# Patient Record
Sex: Male | Born: 1950 | Race: White | Hispanic: No | Smoking: Never smoker
Health system: Southern US, Community
[De-identification: ages and names within clinical notes are randomized; demographics above are authoritative.]

## PROBLEM LIST (undated history)

## (undated) DIAGNOSIS — T7840XA Allergy, unspecified, initial encounter: Secondary | ICD-10-CM

## (undated) DIAGNOSIS — I2510 Atherosclerotic heart disease of native coronary artery without angina pectoris: Secondary | ICD-10-CM

## (undated) DIAGNOSIS — F419 Anxiety disorder, unspecified: Secondary | ICD-10-CM

## (undated) DIAGNOSIS — E785 Hyperlipidemia, unspecified: Secondary | ICD-10-CM

## (undated) DIAGNOSIS — I219 Acute myocardial infarction, unspecified: Secondary | ICD-10-CM

## (undated) DIAGNOSIS — M1990 Unspecified osteoarthritis, unspecified site: Secondary | ICD-10-CM

## (undated) DIAGNOSIS — K4090 Unilateral inguinal hernia, without obstruction or gangrene, not specified as recurrent: Secondary | ICD-10-CM

## (undated) DIAGNOSIS — I509 Heart failure, unspecified: Secondary | ICD-10-CM

## (undated) DIAGNOSIS — F32A Depression, unspecified: Secondary | ICD-10-CM

## (undated) HISTORY — DX: Allergy, unspecified, initial encounter: T78.40XA

## (undated) HISTORY — PX: HERNIA REPAIR: SHX51

## (undated) HISTORY — DX: Atherosclerotic heart disease of native coronary artery without angina pectoris: I25.10

## (undated) HISTORY — DX: Anxiety disorder, unspecified: F41.9

## (undated) HISTORY — PX: VASECTOMY: SHX75

## (undated) HISTORY — DX: Heart failure, unspecified: I50.9

## (undated) HISTORY — DX: Depression, unspecified: F32.A

## (undated) HISTORY — DX: Unspecified osteoarthritis, unspecified site: M19.90

## (undated) HISTORY — DX: Hyperlipidemia, unspecified: E78.5

---

## 2008-07-17 HISTORY — PX: CORONARY ANGIOPLASTY WITH STENT PLACEMENT: SHX49

## 2008-07-24 ENCOUNTER — Inpatient Hospital Stay (HOSPITAL_COMMUNITY): Admission: AD | Admit: 2008-07-24 | Discharge: 2008-07-25 | Payer: Self-pay | Admitting: Cardiovascular Disease

## 2011-01-24 ENCOUNTER — Other Ambulatory Visit: Payer: Self-pay | Admitting: Cardiovascular Disease

## 2011-01-24 DIAGNOSIS — E785 Hyperlipidemia, unspecified: Secondary | ICD-10-CM

## 2011-01-24 NOTE — Telephone Encounter (Signed)
Fax received from pharmacy. Jodette Meerab Maselli RN  

## 2011-03-01 NOTE — Cardiovascular Report (Signed)
NAME:  Jacob, Obrien NO.:  [id number]   MEDICAL RECORD NO.:  [id number]          PATIENT TYPE:  OIB   LOCATION:  2807                         FACILITY:  [facility]   PHYSICIAN:  Vesta Mixer, M.D. DATE OF BIRTH:  1951-05-30   DATE OF PROCEDURE:  07/24/2008  DATE OF DISCHARGE:                            CARDIAC CATHETERIZATION   Jacob Obrien is a 61 year old gentleman who has been recently in  relatively good health.  He does have a history of hypercholesterolemia.  He presented yesterday with symptoms consistent with unstable angina.  He was pain-free at that time.  Cardiac enzymes were drawn and later  returned positive.  He had already been scheduled to have a heart  catheterization this morning.   The procedures were left heart catheterization, coronary angiography  with PTCA, and stenting of the mid left anterior descending artery.   The right femoral artery was easily cannulated using a modified  Seldinger technique.   HEMODYNAMICS:  LV pressure is 106/4 with an aortic pressure of 98/70.   Angiography left main to left main is smooth and normal.   The left anterior descending artery has minor luminal irregularities  proximally.  The mid LAD has a 95%-99% stenosis with TIMI grade 2 flow  down the LAD and diagonal vessel.  The first diagonal artery is a fairly  large and normal vessel.  The second diagonal artery is a moderate-sized  vessel and is also normal.  The distal LAD has minimal luminal  irregularities.  There is mild-to-moderate calcification associated with  this lesion.   The left circumflex artery is moderate in size.  It is fairly smooth and  normal throughout its course.  It gives off moderate-sized obtuse  marginal artery which is normal.   The right coronary artery has an anterior takeoff.  It is large and  dominant.  There are minor luminal irregularities, but no significant  stenosis.  The posterior descending artery and the  posterolateral  segment artery are normal.   The left ventriculogram reveals overall normal left ventricular systolic  function.  The ejection fraction is between 50% and 55%.  There is  slight hypokinesis of the apex.  This actually may just be due to some  ventricular ectopy and delayed contractility.   The left main was engaged using a Judkins left 4 side 6-French guide.  Angiomax was given.  ACT was 271.   A [place] guidewire was positioned down into the distal LAD.  A 3.0 x  15-mm apex was positioned across the stenosis and was inflated up to 6  atmospheres for 27 seconds.  Following this, a 3.0 x 23-mm Promise was  placed across this stenosis.  It was placed across the index stenosis  and then out distally where the vessel was about 50% stenosed following  the lesion.  It was deployed at 10 atmospheres for 30 seconds and was  pulled back a few millimeters and was inflated up to 12 atmospheres for  20 seconds.  This was done to flare the proximal end slightly.  There  was a fairly big mismatch between the proximal end  of this lesion and  the distal end of the lesion.   Following this, a 3.5 x 12-mm [place] was positioned down for post-  dilatation.  It was positioned in the mid lesion and inflated up to 12  atmospheres for 17 seconds.  It was then positioned distally and  inflated up to 7 atmospheres for 17 seconds and then slightly more  distal for 7 atmospheres for 13 seconds.  It was then pulled back  proximally and inflated up to 13 atmospheres for 14 seconds.  This gave  [place] a very nice angiographic result with a 0% residual stenosis.  The  patient did have a little bit of chest pain which resolved fairly  quickly.  The patient was stable leaving the lab.   COMPLICATIONS:  None.   CONCLUSION:  Successful PTCA and stenting of the mid LAD.      Vesta Mixer, M.D.  Electronically Signed     PJN/MEDQ  D:  07/24/2008  T:  07/24/2008  Job:  [id number]   cc:   Gloriajean Dell. Andrey Campanile, M.D.

## 2011-03-01 NOTE — H&P (Signed)
NAME:  TRUE, GARCIAMARTINEZ NO.:  [id number]   MEDICAL RECORD NO.:  [id number]              PATIENT TYPE:   LOCATION:                                 FACILITY:   PHYSICIAN:  Vesta Mixer, M.D. DATE OF BIRTH:  08/07/1951   DATE OF ADMISSION:  DATE OF DISCHARGE:                              HISTORY & PHYSICAL   Jacob Obrien is a 60 year old gentleman with no significant past  medical history.  He is admitted for symptoms of unstable angina for a  heart catheterization.   Jacob Obrien has been a relatively healthy gentleman.  He has remained  quite active.  He has not had any particular problems.  His cholesterol  levels have been a little bit high.   For about a week or so he has been having some very vague episodes of  chest fullness.  For the past 2 days his episodes have gotten acutely  worse.  Yesterday he had some chest pain/fullness that started while he  was walking his dog.  The pain was slight, but it stopped when he  stopped to rest.  It lasted for about an hour or so.  The pain did not  recur.   This morning he was riding his bike to work, and he had very intense  pain.  It was 10/10 chest pain.  It was associated with lots of nausea.  He stopped and laid back, and the pain went away after a few minutes.  He was seen by his medical doctor this morning, was given an aspirin,  and the pain gradually subsided and was mostly relieved.   The pain radiates down into his right arm.  There is no diaphoresis.  He  denies any syncope or presyncope.   CURRENT MEDICATIONS:  Aspirin once a day.   ALLERGIES:  None.   PAST MEDICAL HISTORY:  Mild hypercholesterolemia.   SOCIAL HISTORY:  The patient does not smoke.  He drinks alcohol rarely.   FAMILY HISTORY:  His father has a history of coronary artery disease and  died of a myocardial infarction at age 40.  His mother died of old age  at age 48.  She had a history of angina.   REVIEW OF SYSTEMS:  His  review of systems is reviewed and is essentially  negative except for as noted in the HPI.   PHYSICAL EXAMINATION:  He is a middle-aged gentleman in no acute  distress.  He is alert and oriented x3, and his mood and affect are  normal.  His weight is 171, blood pressure 120/76 with heart rate of 72.  HEENT:  Reveals 2+ carotids.  He has no bruits, no JVD, no thyromegaly.  LUNGS:  Clear.  HEART:  Regular rate, S1, S2.  ABDOMEN:  Reveals good bowel sounds and is nontender.  EXTREMITIES:  He has no clubbing, cyanosis or edema.  NEUROLOGIC:  Nonfocal.   His EKG from Dr. Tawana Scale office reveals normal sinus rhythm.  He has  mild ST-segment depression in the inferior leads.  This is new from his  EKG back in February.  Jacob Obrien presents with some episodes of chest discomfort that are  consistent with unstable angina.  He has been given an aspirin, and I  have recommended that he continue to take an aspirin each day.  I have  given him a prescription for nitroglycerin.  We have also given him some  samples of Lipitor.  He is to start taking Lipitor tonight.  I have  asked him to call right away if he develops any episodes of chest pain  or pressure.  We will admit him for heart catheterization tomorrow  morning at 7:30.  We have discussed the risks, benefits, and options of  heart catheterization with possible angioplasty.  He understands and  agrees to proceed.      Vesta Mixer, M.D.  Electronically Signed     PJN/MEDQ  D:  07/23/2008  T:  07/23/2008  Job:  [id number]   cc:   Gloriajean Dell. Andrey Campanile, M.D.

## 2011-03-04 NOTE — Discharge Summary (Signed)
Jacob Obrien, Jacob Obrien            ACCOUNT NO.:  [id number]   MEDICAL RECORD NO.:  [id number]          PATIENT TYPE:  INP   LOCATION:  6529                         FACILITY:  [facility]   PHYSICIAN:  Vesta Mixer, M.D. DATE OF BIRTH:  December 07, 1950   DATE OF ADMISSION:  07/24/2008  DATE OF DISCHARGE:  07/25/2008                               DISCHARGE SUMMARY   DISCHARGE DIAGNOSES:  1. Coronary artery disease - status post percutaneous transluminal      coronary angioplasty and stenting of the mid left anterior      descending artery.  2. Hyperlipidemia.   DISCHARGE MEDICATIONS:  1. Aspirin 325 mg daily.  2. Plavix 75 mg daily.  3. Lipitor 4 mg daily.  4. Nitroglycerin 0.4 mg sublingually as needed.  5. Tylenol as needed for pain.   DISPOSITION:  The patient will return to see Dr. Elease Hashimoto in 1-2 weeks for  followup visit.   HISTORY:  [name] is a 60 year old gentleman with the history of  chest pains.  He was recently seen in the office.  He was referred for  heart catheterization.   A heart catheterization, he was found to have a mid 99% stenosis in his  LAD.  He had TIMI grade 2 flow.  His left circumflex artery and right  coronary arteries were unremarkable.  His left ventricular systolic  function was approximately 50%.   The patient had successful PTCA and stenting using a 3.0 x 23 mm PROMUS  stent.  It was postdilated using a 3.5-mm balloon.  We obtained a very  nice angiographic result with the 0% residual stenosis.  The patient did  quite well and did not have any further problems.  He will be discharged  on above-noted medications and disposition.  All of his other medical  problems are stable.      Vesta Mixer, M.D.  Electronically Signed     PJN/MEDQ  D:  10/09/2008  T:  10/09/2008  Job:  [id number]   cc:   Gloriajean Dell. Andrey Campanile, M.D.

## 2011-07-19 LAB — BASIC METABOLIC PANEL
CO2: 29
Calcium: 8.9
Creatinine, Ser: 1.03
GFR calc Af Amer: >60
GFR calc non Af Amer: >60
Sodium: 139

## 2011-07-19 LAB — CBC
Hemoglobin: 13
MCHC: 34.1
RBC: 3.86 — ABNORMAL LOW
WBC: 8

## 2011-07-19 LAB — POCT I-STAT, CHEM 8
Calcium, Ion: 1.21
HCT: 46
Hemoglobin: 15.6
Sodium: 140
TCO2: 25

## 2011-09-23 ENCOUNTER — Other Ambulatory Visit: Payer: Self-pay | Admitting: Cardiovascular Disease

## 2011-09-23 NOTE — Telephone Encounter (Signed)
Fax Received. Refill Completed. Jacob Obrien (M.A), Pt needs appointment then refill can be made

## 2011-10-25 ENCOUNTER — Other Ambulatory Visit: Payer: Self-pay | Admitting: *Deleted

## 2011-10-25 MED ORDER — CLOPIDOGREL BISULFATE 75 MG PO TABS: 75.0000 mg | ORAL_TABLET | Freq: Every day | ORAL | Status: DC

## 2011-10-25 NOTE — Telephone Encounter (Signed)
Called pt to see if he could make an appt with Dr. Elease Hashimoto because he have not had an OV since 2009. LM with office number stating that I will the dispensing 15 pills until he makes an appt. Fax Received. Refill Completed. Marion Rosenberry Chowoe (R.M.A)

## 2011-10-27 ENCOUNTER — Other Ambulatory Visit: Payer: Self-pay | Admitting: *Deleted

## 2011-10-27 NOTE — Telephone Encounter (Signed)
Received a  Paper request for a refill for pt.'s Plavix. Pharmacist stated that she received it and pt had not picked it up yet.  Pharmacist stated that pt had not picked up his med back in December. Called pt home number and it is disconnected. Pt needs appointment then refill can be made.Marland KitchenMarland KitchenOctavia Chowoe (R.M.A).

## 2011-11-03 ENCOUNTER — Encounter: Payer: Self-pay | Admitting: Cardiovascular Disease

## 2011-11-07 ENCOUNTER — Encounter: Payer: Self-pay | Admitting: Cardiovascular Disease

## 2011-11-07 ENCOUNTER — Ambulatory Visit (INDEPENDENT_AMBULATORY_CARE_PROVIDER_SITE_OTHER): Payer: BC Managed Care – PPO | Admitting: Cardiovascular Disease

## 2011-11-07 DIAGNOSIS — I2510 Atherosclerotic heart disease of native coronary artery without angina pectoris: Secondary | ICD-10-CM

## 2011-11-07 DIAGNOSIS — E785 Hyperlipidemia, unspecified: Secondary | ICD-10-CM

## 2011-11-07 MED ORDER — CLOPIDOGREL BISULFATE 75 MG PO TABS: 75.0000 mg | ORAL_TABLET | Freq: Every day | ORAL | Status: DC

## 2011-11-07 MED ORDER — ATORVASTATIN CALCIUM 40 MG PO TABS: 40.0000 mg | ORAL_TABLET | Freq: Every day | ORAL | Status: DC

## 2011-11-07 NOTE — Patient Instructions (Signed)
The current medical regimen is effective;  continue present plan and medications.  Follow up in 1 year with Dr Elease Hashimoto.  You will receive a letter in the mail 2 months before you are due.  Please call us when you receive this letter to schedule your follow up appointment.  Your physician recommends that you return for lab work in: 1 year (fasting lipid, liver and basic metabolic panel)

## 2011-11-07 NOTE — Assessment & Plan Note (Signed)
It is doing very well. He remains very active and has not had any episodes of angina. Has stent in his left anterior descending artery. Her to continue with the aspirin and Plavix. I'll see him again in one year. We'll plan on checking lipid levels at that time.

## 2011-11-07 NOTE — Progress Notes (Signed)
    Ernest Mallick Date of Birth  06-11-1951 [facility]     [place] [facility]  [address]    [address]   [address] [place], [place]  [address]    [place], [place]  [address] [phone]  Fax  [phone]  [phone]  Fax [phone]   History of Present Illness:  Problem list: 1. Coronary artery disease-status post PTCA and stenting of his left anterior descending artery-October, 2009. He had placement of a 3.0 x 23 mm Promus stent post dilated using a 3.5 mm x 12 mm [place] Voyager 2. Hyperlipidemia  Ed is a 61 yo with history of coronary artery disease.  He was seen last in the office approximately 2 years ago. He's done very well. He's remains very busy. He's not having episodes of chest pain or shortness breath. He puts in a large garden every year.  Current Outpatient Prescriptions on File Prior to Visit  Medication Sig Dispense Refill  . aspirin 325 MG tablet Take 325 mg by mouth daily.        Marland Kitchen atorvastatin (LIPITOR) 40 MG tablet TAKE 1 TABLET DAILY  30 tablet  11  . clopidogrel (PLAVIX) 75 MG tablet Take 1 tablet (75 mg total) by mouth daily.  15 tablet  0  . Multiple Vitamins-Minerals (MULTIVITAMIN PO) Take by mouth daily.          No Known Allergies  Past Medical History  Diagnosis Date  . Hyperlipidemia   . Coronary artery disease     status post PTCA and stenting of his LAD in October of 2009. He had placement of a 3.0x61mm Promus stent.     Past Surgical History  Procedure Date  . Coronary angioplasty with stent placement 07-2008    stenting of his left anterior descending artery. EF 50-55%. There is slighthypokinesis of the apex. The mid LAD has a 95%-99% stenosis with TIMI grade 2 flow down the LAD and diagonal vessel.    History  Smoking status  . Never Smoker   Smokeless tobacco  . Not on file    History  Alcohol Use  . Yes    Rarely    No family history on file.  Reviw of Systems:  Reviewed in the HPI.  All other systems  are negative.  Physical Exam: BP 120/80  Pulse 80  Ht 5\' 9"  (1.753 m)  Wt 170 lb 6.4 oz (77.293 kg)  BMI 25.16 kg/m2 The patient is alert and oriented x 3.  The mood and affect are normal.   Skin: warm and dry.  Color is normal.    HEENT:   His neck is supple. There is no JVD. His carotids are normal  Lungs: Lungs are clear to auscultation. His back is nontender.   Heart: Regular rate S1-S2. He has no murmurs.    Abdomen: Good bowel sounds. He has no hepatosplenomegaly.  Extremities:  No clubbing cyanosis or edema.  Neuro:  Her exam is nonfocal. His gait is normal.    ECG: NSR, no ST or T changes, normal ECG  Assessment / Plan:

## 2012-01-20 ENCOUNTER — Other Ambulatory Visit: Payer: Self-pay | Admitting: *Deleted

## 2012-01-23 ENCOUNTER — Other Ambulatory Visit: Payer: Self-pay | Admitting: *Deleted

## 2012-01-23 DIAGNOSIS — E785 Hyperlipidemia, unspecified: Secondary | ICD-10-CM

## 2012-01-23 MED ORDER — ATORVASTATIN CALCIUM 40 MG PO TABS: 40.0000 mg | ORAL_TABLET | Freq: Every day | ORAL | Status: DC

## 2012-01-23 NOTE — Telephone Encounter (Signed)
Refilled atorvastatin

## 2012-02-24 ENCOUNTER — Other Ambulatory Visit: Payer: Self-pay | Admitting: *Deleted

## 2012-02-24 MED ORDER — CLOPIDOGREL BISULFATE 75 MG PO TABS: 75.0000 mg | ORAL_TABLET | Freq: Every day | ORAL | Status: DC

## 2012-02-27 ENCOUNTER — Other Ambulatory Visit: Payer: Self-pay | Admitting: Cardiovascular Disease

## 2012-02-27 DIAGNOSIS — E785 Hyperlipidemia, unspecified: Secondary | ICD-10-CM

## 2012-02-27 MED ORDER — CLOPIDOGREL BISULFATE 75 MG PO TABS: 75.0000 mg | ORAL_TABLET | Freq: Every day | ORAL | Status: DC

## 2012-02-27 MED ORDER — ATORVASTATIN CALCIUM 40 MG PO TABS: 40.0000 mg | ORAL_TABLET | Freq: Every day | ORAL | Status: DC

## 2012-05-08 ENCOUNTER — Ambulatory Visit (INDEPENDENT_AMBULATORY_CARE_PROVIDER_SITE_OTHER): Payer: BC Managed Care – PPO | Admitting: Sports Medicine

## 2012-05-08 VITALS — BP 121/80 | Ht 69.0 in | Wt 170.0 lb

## 2012-05-08 DIAGNOSIS — M719 Bursopathy, unspecified: Secondary | ICD-10-CM

## 2012-05-08 DIAGNOSIS — M7520 Bicipital tendinitis, unspecified shoulder: Secondary | ICD-10-CM

## 2012-05-08 DIAGNOSIS — S46819A Strain of other muscles, fascia and tendons at shoulder and upper arm level, unspecified arm, initial encounter: Secondary | ICD-10-CM

## 2012-05-08 DIAGNOSIS — M75100 Unspecified rotator cuff tear or rupture of unspecified shoulder, not specified as traumatic: Secondary | ICD-10-CM

## 2012-05-08 DIAGNOSIS — M75101 Unspecified rotator cuff tear or rupture of right shoulder, not specified as traumatic: Secondary | ICD-10-CM | POA: Insufficient documentation

## 2012-05-08 DIAGNOSIS — M67929 Unspecified disorder of synovium and tendon, unspecified upper arm: Secondary | ICD-10-CM

## 2012-05-08 MED ORDER — NITROGLYCERIN 0.2 MG/HR TD PT24: MEDICATED_PATCH | TRANSDERMAL | Status: DC

## 2012-05-08 NOTE — Patient Instructions (Signed)
Exercises as described with 3 lb dumbbell.  Nitro patch as directed.  F/u in 4-6 weeks.

## 2012-05-08 NOTE — Progress Notes (Signed)
  Subjective:    Patient ID: Cartel Mauss, male    DOB: October 04, 1951, 61 y.o.   MRN: [id number]  HPI Presents with R lateral shoulder pain. Thinks may have begun splitting wood in fall. Tried to decrease activity over winter but without much improvement. Not able to throw overhead pitches. Reaching overhead and picking up something also bothers. Feels pain to lateral shoulder that can radiate to elbow. ? Numbness/tingling. Has not tried medicines or exercises to this point. No previous trouble with this shoulder.  Administrator GDS Review of Systems Per HPI.    Objective:   Physical Exam Shoulders: Full active and passive ROM bilaterally. 5/5 strength bilaterally including all rotator cuff muscles. Reproducible lateral shoulder pain with resisted flexion and empty can testing. Some pain with int/ext rotation. Sensation normal. Negative Speed's. Positive Yergason's.  In clinic u/s: --Partial tear and increased doppler flow seen in body of supraspinatus tendon. This is calcified so I suspect this is old No gapping with motion --Partial tear and edema seen in bicipital tendon. Infraspin and teres minor wnl AC with some increase fluid     Assessment & Plan:  1. Supraspinatus and bicipital tendinopathies.  --Will start on NTG protocol with 1/4 patch to affected area once daily. --Rehab exercises with 3 lb dumbbell. Nothing overhead as of yet. --F/u 4-6 weeks.

## 2012-05-08 NOTE — Assessment & Plan Note (Signed)
We will start on NTG protocol  Also begin gentle RC strength work for rehab  Reck and repeat scan 4 to 6 wks

## 2012-06-19 ENCOUNTER — Encounter: Payer: Self-pay | Admitting: Sports Medicine

## 2012-06-19 ENCOUNTER — Ambulatory Visit (INDEPENDENT_AMBULATORY_CARE_PROVIDER_SITE_OTHER): Payer: BC Managed Care – PPO | Admitting: Sports Medicine

## 2012-06-19 VITALS — BP 128/87 | HR 76 | Ht 69.0 in | Wt 170.0 lb

## 2012-06-19 DIAGNOSIS — M719 Bursopathy, unspecified: Secondary | ICD-10-CM

## 2012-06-19 DIAGNOSIS — M75101 Unspecified rotator cuff tear or rupture of right shoulder, not specified as traumatic: Secondary | ICD-10-CM

## 2012-06-19 NOTE — Assessment & Plan Note (Signed)
Excellent response  By [place] he still has partial bicipital tear with minimal change  Supraspinatus tear is almost healed on scan and exam it is much improved  See plan and we will reck in 6 wks

## 2012-06-19 NOTE — Patient Instructions (Addendum)
Start on exercise sheet as directed. Increasing weight to five lbs except on overhead exercises  Add Biceps exercises. On spokes of the wheel exercise do all three positions up and down Continue nitroglycerin Return appointment in 6 weeks.

## 2012-06-19 NOTE — Progress Notes (Signed)
  Subjective:    Patient ID: Jacob Obrien, male    DOB: 1951/03/11, 61 y.o.   MRN: [id number]  HPI Patient here for f/u of right shoulder pain. Previously seen approx 1 month ago and started on nitroglycerin protocol 1/4 patch daily as well as rotator cuff home exercises. Patient returns today stating that his pain is much better. He feels that his strength is about the same. He is sleeping better and can now reach behind himself without pain. He feels that he is about 60% better. He continues to do his home exercises daily and is doing the clock exercise and shoulder flexion. He has resumed swimming.  Review of Systems     Objective:   Physical Exam Full ROM on right shoulder flexion, extension, external rotation.  Slightly decreased ROM on internal rotation and back scratch of right shoulder as compared to left. 5/5 strength without pain on shoulder flexion, extension, internal and external rotation Negative Neer's, Hawkins 4+/5 on empty can Negative speed's, negative yergason's  Ultrasound Right Shoulder - Small hypoechoic area near proximal biceps tendon c/w fluid collection - Improvement in supraspinatus tendon with decreased hypoechogenicity and decreased doppler flow - normal infraspinatus and subscapular tendons    Assessment & Plan:  #1. Right Supraspinatus partial tear  - Significant improvement with nitroglycerin both in symptoms and on ultrasound assessment  - Continue nitroglycerin patch daily until next appointment  - Continue home exercises. Exercises added. Weight increased to 5lb on all exercises except overhead exercises  - Followup in one month.

## 2012-07-27 ENCOUNTER — Other Ambulatory Visit: Payer: Self-pay | Admitting: Sports Medicine

## 2012-07-31 ENCOUNTER — Ambulatory Visit: Payer: BC Managed Care – PPO | Admitting: Sports Medicine

## 2012-08-23 ENCOUNTER — Ambulatory Visit (INDEPENDENT_AMBULATORY_CARE_PROVIDER_SITE_OTHER): Payer: BC Managed Care – PPO | Admitting: Sports Medicine

## 2012-08-23 ENCOUNTER — Encounter: Payer: Self-pay | Admitting: Sports Medicine

## 2012-08-23 VITALS — BP 133/88 | HR 75 | Ht 69.0 in | Wt 170.0 lb

## 2012-08-23 DIAGNOSIS — M719 Bursopathy, unspecified: Secondary | ICD-10-CM

## 2012-08-23 DIAGNOSIS — M75101 Unspecified rotator cuff tear or rupture of right shoulder, not specified as traumatic: Secondary | ICD-10-CM

## 2012-08-23 NOTE — Assessment & Plan Note (Signed)
-  continues to improve with nitroglycerin and home exercises in regards symptoms and strength -planned ultrasound today to evaluate biceps tendon (supraspinatus had essentially healed at last visit) but patient unable to stay -will plan on repeat ultrasound in 4 weeks with continued nitroglycerin therapy during this time.   -patient is to add tricep extensions overhead and swinging curls to his rehab therapy. Continue weights at 5 lbs.  - Followup in one month.

## 2012-08-23 NOTE — Progress Notes (Signed)
Subjective:   1. Right shoulder pain follow up-Patient has been on nitroglycerin protocol since 05/08/12 related to supraspinatus partial tear and bicipital tendon partial tear. Patient compliant with home exercises and is working with 5 lbs weights. Continues to feel better. States he 80% better from 60% better at last appointment. Did have a slight set back when he played football with family a few weeks ago and experienced increased pain and a clicking noise for 4-5 days.   ROS--Denies nausea/vomiting/fever/chills/fatigue/overall sick feelings. Otherwise see HPI Past Medical History-history CAD Reviewed problem list.  Medications- reviewed and updated Chief complaint-noted  Objective: BP 133/88  Pulse 75  Ht 5\' 9"  (1.753 m)  Wt 170 lb (77.111 kg)  BMI 25.10 kg/m2 Gen: NAD Full ROM on right shoulder flexion, extension, external rotation. Previously decreased ROM on internal rotation and back scratch have resolved and patient has full range of motion.  5/5 strength without pain on shoulder flexion, extension, internal and external rotation. Strength increased to 5/5 on empty can test.  Negative Neer's, Hawkins  Negative yergason's. Patient does experience pain on speed's.   Assessment/Plan: See problem oriented charted

## 2012-09-19 ENCOUNTER — Ambulatory Visit (INDEPENDENT_AMBULATORY_CARE_PROVIDER_SITE_OTHER): Payer: BC Managed Care – PPO | Admitting: Sports Medicine

## 2012-09-19 ENCOUNTER — Encounter: Payer: Self-pay | Admitting: Sports Medicine

## 2012-09-19 VITALS — BP 134/86 | HR 70 | Ht 69.0 in | Wt 170.0 lb

## 2012-09-19 DIAGNOSIS — M75101 Unspecified rotator cuff tear or rupture of right shoulder, not specified as traumatic: Secondary | ICD-10-CM

## 2012-09-19 DIAGNOSIS — M67919 Unspecified disorder of synovium and tendon, unspecified shoulder: Secondary | ICD-10-CM

## 2012-09-19 NOTE — Progress Notes (Signed)
Patient ID: Jacob Obrien, male   DOB: September 13, 1951, 61 y.o.   MRN: [id number]  Patient returns for a four-month followup of a small supraspinatous tendon tear in his right shoulder but she also had some bicipital tendon fluid  He is much better than when he started. However he has returned to splitting some wood. With that he is noted pain when he reaches overhead again but no real weakness. He does not have night pain.  He complains of a quarter patch nitroglycerin without problems.  Examination No acute distress  RT Shoulder shows full range of motion  Right shoulder shows a positive empty can test but with good strength. Hawkins test is more positive. Neer Test is negative. Internal and external rotation are strong. O'Brien's test is negative  Crossover test does cause some pain  Ultrasound examination There continues to be a small area of swelling with some mild loss of tendon fibers in the bicipital tendon 2 cm below the rotator cuff The supraspinatous tendon is visualized and looks significantly improved with the calcification having resolved. On the posterior edge of the supraspinatous tendon is an area of hypoechoic change that correlates with where he had a tear of the early scans Infraspinatus and teres minor are normal A.c. joint shows mild arthritis

## 2012-09-19 NOTE — Assessment & Plan Note (Signed)
I think he is about 80% recovered from this injury. He has to be very careful with the with wood chopping as I think this recreated some swelling and irritation in his supraspinatous and in his biceps tendon.  Continue rehabilitation and nitroglycerin for the next 2 months. Recheck in 2 months

## 2012-09-27 ENCOUNTER — Other Ambulatory Visit: Payer: Self-pay | Admitting: Sports Medicine

## 2012-10-05 ENCOUNTER — Encounter: Payer: Self-pay | Admitting: Cardiovascular Disease

## 2012-11-20 ENCOUNTER — Ambulatory Visit (INDEPENDENT_AMBULATORY_CARE_PROVIDER_SITE_OTHER): Payer: 59 | Admitting: Sports Medicine

## 2012-11-20 VITALS — BP 120/76 | Ht 70.0 in | Wt 170.0 lb

## 2012-11-20 DIAGNOSIS — M719 Bursopathy, unspecified: Secondary | ICD-10-CM

## 2012-11-20 DIAGNOSIS — M75101 Unspecified rotator cuff tear or rupture of right shoulder, not specified as traumatic: Secondary | ICD-10-CM

## 2012-11-20 NOTE — Assessment & Plan Note (Signed)
I think we have reached probably a maximal benefit of nitroglycerin He can wean this off on an every other day basis Keep up biceps exercises to see if he can get a partial tear to heal  Keep up general rotator cuff exercises at least 3 times a week Gradually add to the weight  He can see me when necessary or if not completely resolved in 3 months

## 2012-11-20 NOTE — Progress Notes (Signed)
62 yo M here for follow up of rotator cuff syndrome.  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  Continuing nitro patch and rehab were recommended.  Since that time, the general pain has decreased-- is now only with specific movements such as throwing a football or reaching forward and straight overhead.  Has not noticed any weakness.  No swelling/redness/warmth.    Exam: Gen: No acute distress  R Shoulder: full range of motion; positive empty can test with significant resistance but with good strength. Hawkins test is mildly positive. Neer Test is negative. Internal and external rotation are strong. Speeds and yergason's only cause pain with significant resistance  Ultrasound: Healed supraspinatus Continued, possibly worsening tear of biceps tendon with increased hypoechoic change noted about 2 cm below where it joins the rotator cuff The tendon does not sublux outside the groove and there is still proximal tendon fiber visualized Infraspinatus and teres minor are normal Subscapularis is normal No in impingement visualized on dynamic motion

## 2012-11-22 ENCOUNTER — Other Ambulatory Visit: Payer: Self-pay | Admitting: Sports Medicine

## 2013-03-04 ENCOUNTER — Other Ambulatory Visit: Payer: Self-pay | Admitting: *Deleted

## 2013-03-04 DIAGNOSIS — E785 Hyperlipidemia, unspecified: Secondary | ICD-10-CM

## 2013-03-04 MED ORDER — ATORVASTATIN CALCIUM 40 MG PO TABS: 40.0000 mg | ORAL_TABLET | Freq: Every day | ORAL | Status: DC

## 2013-03-04 NOTE — Telephone Encounter (Signed)
Fax Received. Refill Completed. Jacob Obrien (R.M.A)   

## 2013-03-18 ENCOUNTER — Other Ambulatory Visit: Payer: Self-pay | Admitting: *Deleted

## 2013-03-18 MED ORDER — CLOPIDOGREL BISULFATE 75 MG PO TABS: 75.0000 mg | ORAL_TABLET | Freq: Every day | ORAL | Status: DC

## 2013-03-18 NOTE — Telephone Encounter (Signed)
Pt is aware of office location. Fax Received. Refill Completed. Jacob Obrien (R.M.A)

## 2013-05-01 ENCOUNTER — Other Ambulatory Visit: Payer: Self-pay | Admitting: *Deleted

## 2013-05-01 DIAGNOSIS — E785 Hyperlipidemia, unspecified: Secondary | ICD-10-CM

## 2013-05-01 MED ORDER — ATORVASTATIN CALCIUM 40 MG PO TABS: 40.0000 mg | ORAL_TABLET | Freq: Every day | ORAL | Status: DC

## 2013-05-01 NOTE — Telephone Encounter (Signed)
NO REFILLS UNTIL APPOINTMENT Fax Received. Refill Completed. Cyani Kallstrom Chowoe (R.M.A)   

## 2013-05-13 ENCOUNTER — Ambulatory Visit (INDEPENDENT_AMBULATORY_CARE_PROVIDER_SITE_OTHER): Payer: 59 | Admitting: Cardiovascular Disease

## 2013-05-13 ENCOUNTER — Encounter: Payer: Self-pay | Admitting: Cardiovascular Disease

## 2013-05-13 VITALS — BP 130/88 | HR 78 | Ht 70.0 in | Wt 171.0 lb

## 2013-05-13 DIAGNOSIS — I2510 Atherosclerotic heart disease of native coronary artery without angina pectoris: Secondary | ICD-10-CM

## 2013-05-13 DIAGNOSIS — E785 Hyperlipidemia, unspecified: Secondary | ICD-10-CM

## 2013-05-13 NOTE — Patient Instructions (Addendum)
Your physician wants you to follow-up in: 1 year  You will receive a reminder letter in the mail two months in advance. If you don't receive a letter, please call our office to schedule the follow-up appointment.   Your physician recommends that you return for a FASTING lipid profile: tomorrow

## 2013-05-13 NOTE — Progress Notes (Signed)
Jacob Obrien Date of Birth  [birth year] Jacob [place]     [place] [facility]  [address]    [address]   [address] [place], [place]  [address]    [place], [place]  [address] [phone]  Fax  [phone]  [phone]  Fax [phone]   History of Present Illness:  Problem list: 1. Coronary artery disease-status post PTCA and stenting of his left anterior descending artery-October, 2009. He had placement of a 3.0 x 23 mm Promus stent post dilated using a 3.5 mm x 12 mm [place] Voyager 2. Hyperlipidemia  Jacob Obrien is a 62 yo with history of coronary artery disease.  He was seen last in the office approximately 2 years ago. He's done very well. He's remains very busy. He's not having episodes of chest pain or shortness breath. He puts in a large garden every year.  May 13, 2013:  Jacob Obrien is doing well.  He is retiring in a year.  He has not had any angina.  He remains active.   Current Outpatient Prescriptions on File Prior to Visit  Medication Sig Dispense Refill  . aspirin 81 MG tablet Take 81 mg by mouth daily.      Marland Kitchen atorvastatin (LIPITOR) 40 MG tablet Take 1 tablet (40 mg total) by mouth daily.  30 tablet  0  . clopidogrel (PLAVIX) 75 MG tablet Take 1 tablet (75 mg total) by mouth daily.  90 tablet  0  . Cyanocobalamin (B-12 PO) Take by mouth daily.      . Multiple Vitamins-Minerals (MULTIVITAMIN PO) Take by mouth daily.        . nitroGLYCERIN (NITRODUR - DOSED IN MG/24 HR) 0.2 mg/hr APPLY 1/4 PATCH DAILY FOR 24 HOURS AS DIRECTED.  30 patch  0   No current facility-administered medications on file prior to visit.    No Known Allergies  Past Medical History  Diagnosis Date  . Hyperlipidemia   . Coronary artery disease     status post PTCA and stenting of his LAD in October of 2009. He had placement of a 3.0x34mm Promus stent.     Past Surgical History  Procedure Laterality Date  . Coronary angioplasty with stent placement  07-2008    stenting of his left  anterior descending artery. EF 50-55%. There is slighthypokinesis of the apex. The mid LAD has a 95%-99% stenosis with TIMI grade 2 flow down the LAD and diagonal vessel.    History  Smoking status  . Never Smoker   Smokeless tobacco  . Never Used    History  Alcohol Use  . Yes    Comment: Rarely    No family history on file.  Reviw of Systems:  Reviewed in the HPI.  All other systems are negative.  Physical Exam: BP 130/88  Pulse 78  Ht 5\' 10"  (1.778 m)  Wt 171 lb (77.565 kg)  BMI 24.54 kg/m2 The patient is alert and oriented x 3.  The mood and affect are normal.   Skin: warm and dry.  Color is normal.    HEENT:   His neck is supple. There is no JVD. His carotids are normal  Lungs: Lungs are clear to auscultation. His back is nontender.   Heart: Regular rate S1-S2. He has no murmurs.    Abdomen: Good bowel sounds. He has no hepatosplenomegaly.  Extremities:  No clubbing cyanosis or edema.  Neuro:  Her exam is nonfocal. His gait is normal.    ECG: [place]  28, 2014:  NSR at 78.  Normal ECG.  Assessment / Plan:

## 2013-05-13 NOTE — Assessment & Plan Note (Signed)
[name] is doing very well. He's not had any episodes of chest pain.   He has a history of PCI in 2009. We will continue with his same medications. We'll check fasting lipid profile, basic lipid profile, and hepatic profile tomorrow.  I will see him again in one year for followup office visit, EKG, and fasting labs.

## 2013-05-14 ENCOUNTER — Other Ambulatory Visit (INDEPENDENT_AMBULATORY_CARE_PROVIDER_SITE_OTHER): Payer: 59

## 2013-05-14 DIAGNOSIS — E785 Hyperlipidemia, unspecified: Secondary | ICD-10-CM

## 2013-05-14 DIAGNOSIS — I2510 Atherosclerotic heart disease of native coronary artery without angina pectoris: Secondary | ICD-10-CM

## 2013-05-14 LAB — HEPATIC FUNCTION PANEL
ALT: 26 U/L (ref 0–53)
Albumin: 4.2 g/dL (ref 3.5–5.2)
Alkaline Phosphatase: 53 U/L (ref 39–117)
Bilirubin, Direct: 0 mg/dL (ref 0.0–0.3)
Total Protein: 7 g/dL (ref 6.0–8.3)

## 2013-05-14 LAB — LIPID PANEL
Cholesterol: 157 mg/dL (ref 0–200)
Triglycerides: 62 mg/dL (ref 0.0–149.0)
VLDL: 12.4 mg/dL (ref 0.0–40.0)

## 2013-05-14 LAB — BASIC METABOLIC PANEL
BUN: 22 mg/dL (ref 6–23)
Chloride: 103 mEq/L (ref 96–112)
Creatinine, Ser: 1 mg/dL (ref 0.4–1.5)
GFR: 85.08 mL/min (ref >60.00)
Potassium: 4.2 mEq/L (ref 3.5–5.1)

## 2013-05-20 ENCOUNTER — Other Ambulatory Visit: Payer: Self-pay

## 2013-05-20 MED ORDER — CLOPIDOGREL BISULFATE 75 MG PO TABS: 75.0000 mg | ORAL_TABLET | Freq: Every day | ORAL | Status: DC

## 2013-06-05 ENCOUNTER — Other Ambulatory Visit: Payer: Self-pay | Admitting: Cardiovascular Disease

## 2013-07-11 ENCOUNTER — Telehealth: Payer: Self-pay | Admitting: Cardiovascular Disease

## 2013-07-11 NOTE — Telephone Encounter (Signed)
Walk In pt Form " Papers dropped Off" will hold onto Until Jodette back in Office  On Friday 07/11/13/KM

## 2013-08-04 ENCOUNTER — Other Ambulatory Visit: Payer: Self-pay | Admitting: Sports Medicine

## 2013-08-06 ENCOUNTER — Other Ambulatory Visit: Payer: Self-pay

## 2013-08-06 MED ORDER — ATORVASTATIN CALCIUM 40 MG PO TABS: ORAL_TABLET | ORAL | Status: DC

## 2013-08-22 ENCOUNTER — Other Ambulatory Visit: Payer: Self-pay

## 2013-10-14 ENCOUNTER — Encounter: Payer: Self-pay | Admitting: Cardiovascular Disease

## 2013-10-15 ENCOUNTER — Encounter: Payer: Self-pay | Admitting: Cardiovascular Disease

## 2013-10-21 ENCOUNTER — Other Ambulatory Visit: Payer: Self-pay

## 2013-10-21 MED ORDER — ATORVASTATIN CALCIUM 40 MG PO TABS: ORAL_TABLET | ORAL | Status: DC

## 2013-10-21 MED ORDER — CLOPIDOGREL BISULFATE 75 MG PO TABS: 75.0000 mg | ORAL_TABLET | Freq: Every day | ORAL | Status: DC

## 2014-02-06 ENCOUNTER — Other Ambulatory Visit: Payer: Self-pay | Admitting: Cardiovascular Disease

## 2014-04-11 ENCOUNTER — Other Ambulatory Visit: Payer: Self-pay | Admitting: Cardiovascular Disease

## 2014-04-28 ENCOUNTER — Encounter: Payer: Self-pay | Admitting: Cardiovascular Disease

## 2014-04-28 ENCOUNTER — Ambulatory Visit (INDEPENDENT_AMBULATORY_CARE_PROVIDER_SITE_OTHER): Payer: 59 | Admitting: Cardiovascular Disease

## 2014-04-28 VITALS — BP 124/90 | HR 71 | Ht 70.0 in | Wt 166.0 lb

## 2014-04-28 DIAGNOSIS — I2510 Atherosclerotic heart disease of native coronary artery without angina pectoris: Secondary | ICD-10-CM

## 2014-04-28 DIAGNOSIS — I209 Angina pectoris, unspecified: Secondary | ICD-10-CM

## 2014-04-28 DIAGNOSIS — I25119 Atherosclerotic heart disease of native coronary artery with unspecified angina pectoris: Secondary | ICD-10-CM

## 2014-04-28 LAB — LIPID PANEL
Cholesterol: 147 mg/dL (ref 0–200)
HDL: 68 mg/dL (ref >39.00)
LDL Cholesterol: 66 mg/dL (ref 0–99)
NONHDL: 79
Total CHOL/HDL Ratio: 2
Triglycerides: 66 mg/dL (ref 0.0–149.0)
VLDL: 13.2 mg/dL (ref 0.0–40.0)

## 2014-04-28 LAB — HEPATIC FUNCTION PANEL
ALBUMIN: 4.1 g/dL (ref 3.5–5.2)
ALT: 23 U/L (ref 0–53)
AST: 22 U/L (ref 0–37)
Alkaline Phosphatase: 48 U/L (ref 39–117)
BILIRUBIN TOTAL: 1 mg/dL (ref 0.2–1.2)
Bilirubin, Direct: 0 mg/dL (ref 0.0–0.3)
Total Protein: 6.8 g/dL (ref 6.0–8.3)

## 2014-04-28 LAB — BASIC METABOLIC PANEL
BUN: 23 mg/dL (ref 6–23)
CHLORIDE: 109 meq/L (ref 96–112)
CO2: 25 meq/L (ref 19–32)
Calcium: 8.7 mg/dL (ref 8.4–10.5)
Creatinine, Ser: 0.8 mg/dL (ref 0.4–1.5)
GFR: 99.12 mL/min (ref >60.00)
GLUCOSE: 99 mg/dL (ref 70–99)
POTASSIUM: 4 meq/L (ref 3.5–5.1)
SODIUM: 140 meq/L (ref 135–145)

## 2014-04-28 MED ORDER — CLOPIDOGREL BISULFATE 75 MG PO TABS: 75.0000 mg | ORAL_TABLET | Freq: Every day | ORAL | Status: DC

## 2014-04-28 MED ORDER — ATORVASTATIN CALCIUM 40 MG PO TABS: 40.0000 mg | ORAL_TABLET | Freq: Every day | ORAL | Status: DC

## 2014-04-28 NOTE — Progress Notes (Signed)
Jacob Obrien Date of Birth  Jun 06, 1951 [place] [address]    [address] [place], [place]  [address]    [place], [place]  [address] [phone]  Fax  [phone]  [phone]  Fax [phone]   History of Present Illness:  Problem list: 1. Coronary artery disease-status post PTCA and stenting of his left anterior descending artery-October, 2009. He had placement of a 3.0 x 23 mm Promus stent post dilated using a 3.5 mm x 12 mm [place] Voyager 2. Hyperlipidemia  Jacob Obrien is a 63 yo with history of coronary artery disease.  He was seen last in the office approximately 2 years ago. He's done very well. He's remains very busy. He's not having episodes of chest pain or shortness breath. He puts in a large garden every year.  May 13, 2013:  Jacob Obrien is doing well.  He is retiring in a year.  He has not had any angina.  He remains active.   April 28, 2014:  Jacob Obrien has retired since I last saw him.  Slight muscle aches - may be from the statin. Staying busy - working outside quite .     Current Outpatient Prescriptions on File Prior to Visit  Medication Sig Dispense Refill  . aspirin 81 MG tablet Take 81 mg by mouth 2 (two) times daily.       Marland Kitchen atorvastatin (LIPITOR) 40 MG tablet Take 1 tablet by mouth  every day  30 tablet  3  . clopidogrel (PLAVIX) 75 MG tablet Take 1 tablet by mouth  daily  30 tablet  3  . Cyanocobalamin (B-12 PO) Take by mouth daily.      . fish oil-omega-3 fatty acids 1000 MG capsule Take 2 g by mouth daily.      . Multiple Vitamins-Minerals (MULTIVITAMIN PO) Take by mouth daily.        . nitroGLYCERIN (NITRODUR - DOSED IN MG/24 HR) 0.2 mg/hr APPLY 1/4 PATCH DAILY FOR 24 HOURS AS DIRECTED.  30 patch  0  . vitamin C (ASCORBIC ACID) 250 MG tablet Take 250 mg by mouth daily.       No current facility-administered medications on file prior to visit.    No Known Allergies  Past Medical History  Diagnosis Date  .  Hyperlipidemia   . Coronary artery disease     status post PTCA and stenting of his LAD in October of 2009. He had placement of a 3.0x74mm Promus stent.     Past Surgical History  Procedure Laterality Date  . Coronary angioplasty with stent placement  07-2008    stenting of his left anterior descending artery. EF 50-55%. There is slighthypokinesis of the apex. The mid LAD has a 95%-99% stenosis with TIMI grade 2 flow down the LAD and diagonal vessel.    History  Smoking status  . Never Smoker   Smokeless tobacco  . Never Used    History  Alcohol Use  . Yes    Comment: Rarely    No family history on file.  Reviw of Systems:  Reviewed in the HPI.  All other systems are negative.  Physical Exam: BP 124/90  Pulse 71  Ht 5\' 10"  (1.778 m)  Wt 166 lb (75.297 kg)  BMI 23.82 kg/m2 The patient is alert and oriented x 3.  The mood and affect are normal.   Skin: warm and dry.  Color is normal.  HEENT:   His neck is supple. There is no JVD. His carotids are normal  Lungs: Lungs are clear to auscultation. His back is nontender.   Heart: Regular rate S1-S2. He has no murmurs.    Abdomen: Good bowel sounds. He has no hepatosplenomegaly.  Extremities:  No clubbing cyanosis or edema.  Neuro:  Her exam is nonfocal. His gait is normal.    ECG: April 28, 2014:  NSR at 4  Assessment / Plan:

## 2014-04-28 NOTE — Patient Instructions (Signed)
Your physician recommends that you have lab work:  TODAY - cholesterol, liver and basic metabolic profile  Your physician recommends that you continue on your current medications as directed. Please refer to the Current Medication list given to you today.  Your physician wants you to follow-up in: 1 year with Dr. Acie Fredrickson.  You will receive a reminder letter in the mail two months in advance. If you don't receive a letter, please call our office to schedule the follow-up appointment.

## 2014-04-28 NOTE — Assessment & Plan Note (Signed)
It is doing very well. He is retired from [employer]. He is active he to lots of outside work cycling and regular basis.  He's having some muscle aches primarily in his left leg but is not sure that it is due to the atorvastatin.  He's very active and he may just have strained a muscle.   I've advised him to hold his atorvastatin for couple weeks to see if the pains get better.  Otherwise he is doing very well.  Check fasting lipids, liver enzymes and a basic met.  profile today. I will see  again in one year.

## 2014-11-14 ENCOUNTER — Other Ambulatory Visit: Payer: Self-pay | Admitting: *Deleted

## 2014-11-14 MED ORDER — CLOPIDOGREL BISULFATE 75 MG PO TABS: 75.0000 mg | ORAL_TABLET | Freq: Every day | ORAL | Status: DC

## 2014-11-17 ENCOUNTER — Other Ambulatory Visit: Payer: Self-pay

## 2014-11-17 MED ORDER — ATORVASTATIN CALCIUM 40 MG PO TABS: 40.0000 mg | ORAL_TABLET | Freq: Every day | ORAL | Status: DC

## 2015-03-18 ENCOUNTER — Other Ambulatory Visit: Payer: Self-pay

## 2015-03-18 MED ORDER — ATORVASTATIN CALCIUM 40 MG PO TABS: 40.0000 mg | ORAL_TABLET | Freq: Every day | ORAL | Status: DC

## 2015-03-18 MED ORDER — CLOPIDOGREL BISULFATE 75 MG PO TABS: 75.0000 mg | ORAL_TABLET | Freq: Every day | ORAL | Status: DC

## 2015-06-30 ENCOUNTER — Encounter (HOSPITAL_COMMUNITY): Payer: Self-pay | Admitting: Emergency Medicine

## 2015-06-30 ENCOUNTER — Emergency Department (HOSPITAL_COMMUNITY)
Admission: EM | Admit: 2015-06-30 | Discharge: 2015-06-30 | Disposition: A | Discharge status: Home / Self Care | Payer: No Typology Code available for payment source | Attending: Emergency Medicine | Admitting: Emergency Medicine

## 2015-06-30 ENCOUNTER — Emergency Department (HOSPITAL_COMMUNITY): Payer: No Typology Code available for payment source

## 2015-06-30 DIAGNOSIS — I2510 Atherosclerotic heart disease of native coronary artery without angina pectoris: Secondary | ICD-10-CM | POA: Diagnosis not present

## 2015-06-30 DIAGNOSIS — S80211A Abrasion, right knee, initial encounter: Secondary | ICD-10-CM | POA: Diagnosis not present

## 2015-06-30 DIAGNOSIS — Z7982 Long term (current) use of aspirin: Secondary | ICD-10-CM | POA: Insufficient documentation

## 2015-06-30 DIAGNOSIS — Y92410 Unspecified street and highway as the place of occurrence of the external cause: Secondary | ICD-10-CM | POA: Diagnosis not present

## 2015-06-30 DIAGNOSIS — Y939 Activity, unspecified: Secondary | ICD-10-CM | POA: Diagnosis not present

## 2015-06-30 DIAGNOSIS — S60222A Contusion of left hand, initial encounter: Secondary | ICD-10-CM | POA: Insufficient documentation

## 2015-06-30 DIAGNOSIS — S40812A Abrasion of left upper arm, initial encounter: Secondary | ICD-10-CM | POA: Diagnosis not present

## 2015-06-30 DIAGNOSIS — M25532 Pain in left wrist: Secondary | ICD-10-CM

## 2015-06-30 DIAGNOSIS — S50812A Abrasion of left forearm, initial encounter: Secondary | ICD-10-CM | POA: Insufficient documentation

## 2015-06-30 DIAGNOSIS — Y999 Unspecified external cause status: Secondary | ICD-10-CM | POA: Diagnosis not present

## 2015-06-30 DIAGNOSIS — S60511A Abrasion of right hand, initial encounter: Secondary | ICD-10-CM | POA: Diagnosis not present

## 2015-06-30 DIAGNOSIS — E785 Hyperlipidemia, unspecified: Secondary | ICD-10-CM | POA: Diagnosis not present

## 2015-06-30 DIAGNOSIS — Z9861 Coronary angioplasty status: Secondary | ICD-10-CM | POA: Diagnosis not present

## 2015-06-30 DIAGNOSIS — S40211A Abrasion of right shoulder, initial encounter: Secondary | ICD-10-CM | POA: Insufficient documentation

## 2015-06-30 DIAGNOSIS — T148XXA Other injury of unspecified body region, initial encounter: Secondary | ICD-10-CM

## 2015-06-30 DIAGNOSIS — Z79899 Other long term (current) drug therapy: Secondary | ICD-10-CM | POA: Insufficient documentation

## 2015-06-30 DIAGNOSIS — S7001XA Contusion of right hip, initial encounter: Secondary | ICD-10-CM | POA: Diagnosis not present

## 2015-06-30 DIAGNOSIS — S80812A Abrasion, left lower leg, initial encounter: Secondary | ICD-10-CM | POA: Diagnosis not present

## 2015-06-30 DIAGNOSIS — S60512A Abrasion of left hand, initial encounter: Secondary | ICD-10-CM | POA: Insufficient documentation

## 2015-06-30 DIAGNOSIS — S80212A Abrasion, left knee, initial encounter: Secondary | ICD-10-CM | POA: Insufficient documentation

## 2015-06-30 DIAGNOSIS — S4991XA Unspecified injury of right shoulder and upper arm, initial encounter: Secondary | ICD-10-CM | POA: Diagnosis present

## 2015-06-30 MED ORDER — ACETAMINOPHEN 500 MG PO TABS
1000.0000 mg | ORAL_TABLET | Freq: Once | ORAL | Status: AC
  Administered 2015-06-30: 1000 mg via ORAL
  Filled 2015-06-30: qty 2
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
## 2015-06-30 NOTE — Discharge Instructions (Signed)

## 2015-06-30 NOTE — ED Notes (Signed)
Patient transported to X-ray 

## 2016-02-16 ENCOUNTER — Encounter: Payer: Self-pay | Admitting: Cardiovascular Disease

## 2016-02-16 ENCOUNTER — Ambulatory Visit (INDEPENDENT_AMBULATORY_CARE_PROVIDER_SITE_OTHER): Payer: Medicare Other | Admitting: Cardiovascular Disease

## 2016-02-16 VITALS — BP 108/70 | HR 80 | Ht 70.0 in | Wt 156.8 lb

## 2016-02-16 DIAGNOSIS — I25119 Atherosclerotic heart disease of native coronary artery with unspecified angina pectoris: Secondary | ICD-10-CM

## 2016-02-16 DIAGNOSIS — I2510 Atherosclerotic heart disease of native coronary artery without angina pectoris: Secondary | ICD-10-CM

## 2016-02-16 DIAGNOSIS — E785 Hyperlipidemia, unspecified: Secondary | ICD-10-CM

## 2016-02-16 LAB — COMPREHENSIVE METABOLIC PANEL
ALBUMIN: 4.2 g/dL (ref 3.6–5.1)
ALK PHOS: 50 U/L (ref 40–115)
ALT: 22 U/L (ref 9–46)
AST: 25 U/L (ref 10–35)
BILIRUBIN TOTAL: 0.6 mg/dL (ref 0.2–1.2)
BUN: 26 mg/dL — ABNORMAL HIGH (ref 7–25)
CALCIUM: 9 mg/dL (ref 8.6–10.3)
CO2: 25 mmol/L (ref 20–31)
CREATININE: 0.93 mg/dL (ref 0.70–1.25)
Chloride: 103 mmol/L (ref 98–110)
Glucose, Bld: 55 mg/dL — ABNORMAL LOW (ref 65–99)
Potassium: 4.5 mmol/L (ref 3.5–5.3)
SODIUM: 139 mmol/L (ref 135–146)
Total Protein: 6.2 g/dL (ref 6.1–8.1)

## 2016-02-16 LAB — LIPID PANEL
CHOL/HDL RATIO: 2.2 ratio (ref <=5.0)
CHOLESTEROL: 185 mg/dL (ref 125–200)
HDL: 83 mg/dL (ref >=40)
LDL Cholesterol: 88 mg/dL (ref <130)
Triglycerides: 71 mg/dL (ref <150)
VLDL: 14 mg/dL (ref <30)

## 2016-02-16 MED ORDER — ROSUVASTATIN CALCIUM 10 MG PO TABS: 10.0000 mg | ORAL_TABLET | Freq: Every day | ORAL | Status: DC

## 2016-02-16 MED ORDER — CLOPIDOGREL BISULFATE 75 MG PO TABS: 75.0000 mg | ORAL_TABLET | Freq: Every day | ORAL | Status: DC
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
## 2016-02-16 NOTE — Patient Instructions (Signed)
Medication Instructions:  STOP Atorvastatin START Rosuvastatin 10 mg once daily   Labwork: TODAY - cholesterol, complete metabolic panel  Your physician recommends that you return for lab work in: 3 months - cholesterol, complete metabolic panel You will need to FAST for this appointment - nothing to eat or drink after midnight the night before except water.   Testing/Procedures: None Ordered   Follow-Up: Your physician wants you to follow-up in: 1 year with Dr. Acie Fredrickson.  You will receive a reminder letter in the mail two months in advance. If you don't receive a letter, please call our office to schedule the follow-up appointment.   If you need a refill on your cardiac medications before your next appointment, please call your pharmacy.   Thank you for choosing [facility]! Christen Bame, RN [phone]

## 2016-05-23 ENCOUNTER — Other Ambulatory Visit: Payer: Medicare Other

## 2016-05-25 ENCOUNTER — Other Ambulatory Visit (INDEPENDENT_AMBULATORY_CARE_PROVIDER_SITE_OTHER): Payer: Medicare Other

## 2016-05-25 DIAGNOSIS — E785 Hyperlipidemia, unspecified: Secondary | ICD-10-CM

## 2016-05-25 DIAGNOSIS — I2510 Atherosclerotic heart disease of native coronary artery without angina pectoris: Secondary | ICD-10-CM | POA: Diagnosis not present

## 2016-05-25 LAB — COMPREHENSIVE METABOLIC PANEL
ALBUMIN: 4.2 g/dL (ref 3.6–5.1)
ALK PHOS: 49 U/L (ref 40–115)
ALT: 24 U/L (ref 9–46)
AST: 25 U/L (ref 10–35)
BILIRUBIN TOTAL: 0.9 mg/dL (ref 0.2–1.2)
BUN: 25 mg/dL (ref 7–25)
CALCIUM: 9.4 mg/dL (ref 8.6–10.3)
CO2: 28 mmol/L (ref 20–31)
Chloride: 101 mmol/L (ref 98–110)
Creat: 0.94 mg/dL (ref 0.70–1.25)
Glucose, Bld: 99 mg/dL (ref 65–99)
Potassium: 4.5 mmol/L (ref 3.5–5.3)
Sodium: 138 mmol/L (ref 135–146)
TOTAL PROTEIN: 6.5 g/dL (ref 6.1–8.1)

## 2016-05-25 LAB — LIPID PANEL
CHOLESTEROL: 150 mg/dL (ref 125–200)
HDL: 83 mg/dL (ref >=40)
LDL Cholesterol: 55 mg/dL (ref <130)
TRIGLYCERIDES: 60 mg/dL (ref <150)
Total CHOL/HDL Ratio: 1.8 Ratio (ref <=5.0)
VLDL: 12 mg/dL (ref <30)

## 2016-10-06 ENCOUNTER — Telehealth: Payer: Self-pay

## 2016-10-06 NOTE — Telephone Encounter (Signed)
Prior auth for Rosuvastatin 10mg  submitted to [facility].

## 2016-10-07 ENCOUNTER — Telehealth: Payer: Self-pay

## 2016-10-07 NOTE — Telephone Encounter (Signed)
Tier exception for Rosuvastatin denied by [facility]. Patient will receive a letter to this affect as well.

## 2016-10-25 ENCOUNTER — Telehealth: Payer: Self-pay

## 2016-10-25 NOTE — Telephone Encounter (Signed)
Appeal for tiering exception of rosuvastatin 10mg  faxed to [facility].

## 2016-10-31 ENCOUNTER — Telehealth: Payer: Self-pay | Admitting: Cardiovascular Disease

## 2016-10-31 NOTE — Telephone Encounter (Signed)
Routing to [place] (pt assistance)

## 2016-10-31 NOTE — Telephone Encounter (Signed)
New message  Remo Lipps  From [facility] call requesting to speak with RN. Remo Lipps states he will be mailing and faxing over paperwork for pt to be approved for medication Crestor. Please call back to discuss

## 2016-11-01 NOTE — Telephone Encounter (Signed)
Fax from [facility] this a.m With a redetermination of our appeal for Rosuvastatin. It has now been approved till 10/16/2017. Patient aware of this as well.
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
## 2017-01-20 ENCOUNTER — Other Ambulatory Visit: Payer: Self-pay | Admitting: Cardiovascular Disease

## 2017-03-22 ENCOUNTER — Encounter: Payer: Self-pay | Admitting: Cardiovascular Disease

## 2017-03-22 ENCOUNTER — Ambulatory Visit (INDEPENDENT_AMBULATORY_CARE_PROVIDER_SITE_OTHER): Payer: Medicare Other | Admitting: Cardiovascular Disease

## 2017-03-22 VITALS — BP 130/70 | HR 63 | Ht 70.0 in | Wt 162.0 lb

## 2017-03-22 DIAGNOSIS — I2510 Atherosclerotic heart disease of native coronary artery without angina pectoris: Secondary | ICD-10-CM | POA: Diagnosis not present

## 2017-03-22 DIAGNOSIS — E782 Mixed hyperlipidemia: Secondary | ICD-10-CM

## 2017-03-22 DIAGNOSIS — I209 Angina pectoris, unspecified: Secondary | ICD-10-CM

## 2017-03-22 DIAGNOSIS — I25119 Atherosclerotic heart disease of native coronary artery with unspecified angina pectoris: Secondary | ICD-10-CM | POA: Diagnosis not present

## 2017-03-22 LAB — COMPREHENSIVE METABOLIC PANEL
ALK PHOS: 50 IU/L (ref 39–117)
ALT: 22 IU/L (ref 0–44)
AST: 23 IU/L (ref 0–40)
Albumin/Globulin Ratio: 2.5 — ABNORMAL HIGH (ref 1.2–2.2)
Albumin: 4.5 g/dL (ref 3.6–4.8)
BILIRUBIN TOTAL: 0.6 mg/dL (ref 0.0–1.2)
BUN/Creatinine Ratio: 25 — ABNORMAL HIGH (ref 10–24)
BUN: 22 mg/dL (ref 8–27)
CHLORIDE: 101 mmol/L (ref 96–106)
CO2: 25 mmol/L (ref 18–29)
CREATININE: 0.87 mg/dL (ref 0.76–1.27)
Calcium: 9.5 mg/dL (ref 8.6–10.2)
GFR calc Af Amer: 104 mL/min/{1.73_m2} (ref >59)
GFR calc non Af Amer: 90 mL/min/{1.73_m2} (ref >59)
GLUCOSE: 88 mg/dL (ref 65–99)
Globulin, Total: 1.8 g/dL (ref 1.5–4.5)
Potassium: 4.5 mmol/L (ref 3.5–5.2)
Sodium: 140 mmol/L (ref 134–144)
TOTAL PROTEIN: 6.3 g/dL (ref 6.0–8.5)

## 2017-03-22 LAB — LIPID PANEL
CHOL/HDL RATIO: 2.1 ratio (ref 0.0–5.0)
Cholesterol, Total: 155 mg/dL (ref 100–199)
HDL: 74 mg/dL (ref >39)
LDL Calculated: 69 mg/dL (ref 0–99)
Triglycerides: 61 mg/dL (ref 0–149)
VLDL CHOLESTEROL CAL: 12 mg/dL (ref 5–40)

## 2017-03-22 MED ORDER — CLOPIDOGREL BISULFATE 75 MG PO TABS: 75.0000 mg | ORAL_TABLET | Freq: Every day | ORAL | 3 refills | Status: DC

## 2017-03-22 MED ORDER — ROSUVASTATIN CALCIUM 10 MG PO TABS: 10.0000 mg | ORAL_TABLET | Freq: Every day | ORAL | 3 refills | Status: DC

## 2017-03-22 NOTE — Patient Instructions (Signed)
Medication Instructions:  Your physician recommends that you continue on your current medications as directed. Please refer to the Current Medication list given to you today.   Labwork: TODAY - cholesterol, complete metabolic panel   Testing/Procedures: None Ordered   Follow-Up: Your physician wants you to follow-up in: 1 year with Dr. Acie Fredrickson.  You will receive a reminder letter in the mail two months in advance. If you don't receive a letter, please call our office to schedule the follow-up appointment.   If you need a refill on your cardiac medications before your next appointment, please call your pharmacy.   Thank you for choosing [facility]! Christen Bame, RN [phone]

## 2017-03-22 NOTE — Progress Notes (Signed)
Jacob Obrien Date of Birth  Mar 08, 1951 [place] [address]    [address] [place], [place]  [address]    [place], [place]  [address] [phone]  Fax  [phone]  [phone]  Fax [phone]   History of Present Illness:  Problem list: 1. Coronary artery disease-status post PTCA and stenting of his left anterior descending artery-October, 2009. He had placement of a 3.0 x 23 mm Promus stent post dilated using a 3.5 mm x 12 mm [place] Voyager 2. Hyperlipidemia  Jacob Obrien is a 66 yo with history of coronary artery disease.  He was seen last in the office approximately 2 years ago. He's done very well. He's remains very busy. He's not having episodes of chest pain or shortness breath. He puts in a large garden every year.  May 13, 2013:  Jacob Obrien is doing well.  He is retiring in a year.  He has not had any angina.  He remains active.   April 28, 2014:  Jacob Obrien has retired since I last saw him.  Slight muscle aches - may be from the statin. Staying busy - working outside quite .    Feb 16, 2016:  Doing well. Staying active. Had a motorcycle last sept.  Has slowed his exercise. Lots of walking  Having muscle aches with Atorvastatin  Is willing to try crestor   March 22, 2017:  Jacob Obrien is doing well.  No CP or dyspnea Tolerating the rosuvastatin  Exercising 3 days a week.   Current Outpatient Prescriptions on File Prior to Visit  Medication Sig Dispense Refill  . aspirin 81 MG tablet Take 81 mg by mouth 2 (two) times daily.     . clopidogrel (PLAVIX) 75 MG tablet Take 1 tablet (75 mg total) by mouth daily. *Please call and schedule a one year follow up appointment* 90 tablet 0  . Cyanocobalamin (B-12 PO) Take 1 tablet by mouth daily.     . finasteride (PROSCAR) 5 MG tablet Take 5 mg by mouth daily.    . fish oil-omega-3 fatty acids 1000 MG capsule Take 1 g by mouth daily.     . Glucosamine-Chondroitin (GLUCOSAMINE CHONDR COMPLEX PO)  Take 1 tablet by mouth daily.    . Multiple Vitamins-Minerals (MULTIVITAMIN PO) Take 1 tablet by mouth daily.     . rosuvastatin (CRESTOR) 10 MG tablet Take 1 tablet (10 mg total) by mouth daily. *Please call and schedule a one year follow up appointment* 90 tablet 0  . terazosin (HYTRIN) 5 MG capsule Take 5 mg by mouth daily.    . vitamin C (ASCORBIC ACID) 250 MG tablet Take 250 mg by mouth daily.     No current facility-administered medications on file prior to visit.     No Known Allergies  Past Medical History:  Diagnosis Date  . Coronary artery disease    status post PTCA and stenting of his LAD in October of 2009. He had placement of a 3.0x11mm Promus stent.   . Hyperlipidemia     Past Surgical History:  Procedure Laterality Date  . CORONARY ANGIOPLASTY WITH STENT PLACEMENT  07-2008   stenting of his left anterior descending artery. EF 50-55%. There is slighthypokinesis of the apex. The mid LAD has a 95%-99% stenosis with TIMI grade 2 flow down the LAD and diagonal vessel.    History  Smoking Status  . Never Smoker  Smokeless Tobacco  . Never  Used    History  Alcohol Use  . Yes    Comment: Rarely    Family History  Problem Relation Age of Onset  . Angina Mother   . CAD Father     Reviw of Systems:  Reviewed in the HPI.  All other systems are negative.  Physical Exam: BP 130/70   Pulse 63   Ht 5\' 10"  (1.778 m)   Wt 162 lb (73.5 kg)   SpO2 98%   BMI 23.24 kg/m  The patient is alert and oriented x 3.  The mood and affect are normal.  Skin: warm and dry.  Color is normal.   HEENT:   His neck is supple. There is no JVD. His carotids are normal Lungs: Lungs are clear to auscultation. His back is nontender.  Heart: Regular rate S1-S2. He has no murmurs.   Abdomen: Good bowel sounds. He has no hepatosplenomegaly. Extremities:  No clubbing cyanosis or edema. Neuro:  Her exam is nonfocal. His gait is normal.    ECG: March 22, 2017.  NSR ,  No ST or T wave  changes.   Assessment / Plan:   1. Coronary artery disease-status post PTCA and stenting of his left anterior descending artery-October, 2009. He had placement of a 3.0 x 23 mm Promus stent post dilated using a 3.5 mm x 12 mm [place] Voyager.  exercising regularly . Doing well. No angina   2. Hyperlipidemia Did not tolerate the atorvastatin  On crestor 10 mg a day   check labs today      Jacob Moores, MD  03/22/2017 8:14 AM    [address],  [place], [place]  [address] Pager [phone] Phone: [phone]; Fax: [phone]

## 2017-03-28 ENCOUNTER — Other Ambulatory Visit: Payer: Self-pay | Admitting: Cardiovascular Disease

## 2017-10-09 ENCOUNTER — Telehealth: Payer: Self-pay

## 2017-10-09 NOTE — Telephone Encounter (Signed)
plavix not reviweable by pharmacy.  Patient not on any anticoagulants.  Will defer to preop pool

## 2017-10-09 NOTE — Telephone Encounter (Signed)
   [facility] Pre-operative Risk Assessment    Request for surgical clearance:  1. What type of surgery is being performed? Right inguinal hernia repair w/ mesh   2. When is this surgery scheduled? TBD  3. Are there any medications that need to be held prior to surgery and how long? The patient is currently on Plavix so we need instructions from you as to how the patient should HOLD medication preoperatively. Please advise.   4. Practice name and name of physician performing surgery? [facility]. Dr. Rolm Bookbinder.   5. What is your office phone and fax number? Phone: [phone]. Fax: [phone]. ATTNLars Mage Spillers  6. Anesthesia type (None, local, MAC, general) ? GENERAL   Stephannie Peters 10/09/2017, 9:50 AM  _________________________________________________________________   (provider comments below)

## 2017-10-11 NOTE — Telephone Encounter (Signed)
   Primary Cardiologist: Dr. Acie Fredrickson  Chart reviewed as part of pre-operative protocol coverage. Patient was contacted 10/11/2017 in reference to pre-operative risk assessment for pending surgery as outlined below.  Jacob Obrien was last seen on 03/22/2017 by Dr. Acie Fredrickson.  Since that day, Jacob Obrien has done well. He jog on a daily basis and has no problem climbing 2 flght of stairs or walk 2 block away from his house and back.   Therefore, based on ACC/AHA guidelines, the patient would be at acceptable risk for the planned procedure without further cardiovascular testing.   Since he is on plavix, I will double check with Dr. Acie Fredrickson to see if patient is ok to hold plavix for 5 days prior to the surgery and restart after the surgery as soon as possible.   [place], [place] 10/11/2017, 3:04 PM

## 2017-10-12 NOTE — Telephone Encounter (Signed)
Pt is at low risk for hernia repair He may hold the plavix for 5 days prior to surgery I would prefer that he continue aspirin 81 mg if OK with Dr. Donne Hazel. If he needs to hold the aspirin, it will be OK for him to hold it for 5 days

## 2017-10-12 NOTE — Telephone Encounter (Signed)
   Patient has been cleared for his procedure as outlined below. Can hold Plavix for 5 days and would prefer for him to continue on ASA 81mg  daily if OK with the surgeon.   I will route this recommendation to the requesting party via Epic fax function and remove from pre-op pool.  Please call with questions.  Erma Heritage, PA-C 10/12/2017, 1:42 PM

## 2017-10-18 ENCOUNTER — Other Ambulatory Visit: Payer: Self-pay | Admitting: General Surgery

## 2017-10-25 ENCOUNTER — Encounter (HOSPITAL_COMMUNITY): Payer: Self-pay

## 2017-10-25 ENCOUNTER — Encounter (HOSPITAL_COMMUNITY)
Admission: RE | Admit: 2017-10-25 | Discharge: 2017-10-25 | Disposition: A | Discharge status: Home / Self Care | Payer: Medicare Other | Source: Ambulatory Visit | Attending: General Surgery | Admitting: General Surgery

## 2017-10-25 ENCOUNTER — Other Ambulatory Visit: Payer: Self-pay

## 2017-10-25 DIAGNOSIS — E785 Hyperlipidemia, unspecified: Secondary | ICD-10-CM | POA: Insufficient documentation

## 2017-10-25 DIAGNOSIS — Z7982 Long term (current) use of aspirin: Secondary | ICD-10-CM | POA: Insufficient documentation

## 2017-10-25 DIAGNOSIS — Z01812 Encounter for preprocedural laboratory examination: Secondary | ICD-10-CM | POA: Insufficient documentation

## 2017-10-25 DIAGNOSIS — I2510 Atherosclerotic heart disease of native coronary artery without angina pectoris: Secondary | ICD-10-CM | POA: Diagnosis not present

## 2017-10-25 DIAGNOSIS — K4090 Unilateral inguinal hernia, without obstruction or gangrene, not specified as recurrent: Secondary | ICD-10-CM | POA: Insufficient documentation

## 2017-10-25 HISTORY — DX: Unilateral inguinal hernia, without obstruction or gangrene, not specified as recurrent: K40.90

## 2017-10-25 HISTORY — DX: Acute myocardial infarction, unspecified: I21.9

## 2017-10-25 LAB — CBC
HEMATOCRIT: 45.5 % (ref 39.0–52.0)
Hemoglobin: 15.3 g/dL (ref 13.0–17.0)
MCH: 33 pg (ref 26.0–34.0)
MCHC: 33.6 g/dL (ref 30.0–36.0)
MCV: 98.3 fL (ref 78.0–100.0)
Platelets: 198 10*3/uL (ref 150–400)
RBC: 4.63 MIL/uL (ref 4.22–5.81)
RDW: 12.8 % (ref 11.5–15.5)
WBC: 5.4 10*3/uL (ref 4.0–10.5)

## 2017-10-25 LAB — BASIC METABOLIC PANEL
Anion gap: 8 (ref 5–15)
BUN: 23 mg/dL — AB (ref 6–20)
CO2: 26 mmol/L (ref 22–32)
Calcium: 9.1 mg/dL (ref 8.9–10.3)
Chloride: 104 mmol/L (ref 101–111)
Creatinine, Ser: 0.9 mg/dL (ref 0.61–1.24)
GFR calc Af Amer: >60 mL/min (ref >60)
GLUCOSE: 76 mg/dL (ref 65–99)
Potassium: 4.4 mmol/L (ref 3.5–5.1)
Sodium: 138 mmol/L (ref 135–145)

## 2017-10-25 NOTE — Progress Notes (Signed)
[facility]  Cardiologist - Dr. Tracey Harries  Chest x-ray - Denies  EKG - 03/22/17 (E)  Stress Test - Denies  ECHO - Denies  Cardiac Cath - 2009 (E)  Sleep Study - No CPAP - None  LABS- 10/25/17: CBC, BMP  Pt sts he took his last dose of Plavix 1/8, and he was told to continue taking Aspirin. Cardiac clearance in Epic from Dr. Acie Fredrickson.  Anesthesia- Yes- Cardiac clearance note.  Pt denies having chest pain, sob, or fever at this time. All instructions explained to the pt, with a verbal understanding of the material. Pt agrees to go over the instructions while at home for a better understanding. The opportunity to ask questions was provided.

## 2017-10-25 NOTE — Pre-Procedure Instructions (Addendum)
Jacob Obrien  10/25/2017      [id number] - [place], [place] - [address] AT [address] [place] [address] Phone: [phone] Fax: [phone]    Your procedure is scheduled on Monday, October 30, 2017  Report to [facility] Admitting Entrance "A" at 9:00AM   Call this number if you have problems the morning of surgery:  [phone]   Remember:  Do not eat food or drink liquids after midnight.  Take these medicines the morning of surgery with A SIP OF WATER: Finasteride (PROSCAR). If needed Acetaminophen (TYLENOL) for pain.  Follow your doctor's instruction regarding Aspirin and Plavix.  As of today, stop taking all Aspirins, Vitamins, Fish oils, and Herbal medications. Also stop all NSAIDS i.e. Advil, Ibuprofen, Motrin, Aleve, Anaprox, Naproxen, BC and Goody Powders. That includes: Coenzyme Q10, Cyanocobalamin, and Glucosamine-Chondroitin  Please complete your PRE-SURGERY ENSURE that was given to before you leave your house the morning of surgery.  Please, if able, drink it in one setting. DO NOT SIP.   Do not wear jewelry.  Do not wear lotions, powders, colognes, or deodorant.  Do not shave 48 hours prior to surgery.  Men may shave face and neck.  Do not bring valuables to the hospital.  [facility] is not responsible for any belongings or valuables.  Contacts, dentures or bridgework may not be worn into surgery.  Leave your suitcase in the car.  After surgery it may be brought to your room.  For patients admitted to the hospital, discharge time will be determined by your treatment team.  Patients discharged the day of surgery will not be allowed to drive home.   Special instructions:  [place]- Preparing For Surgery  Before surgery, you can play an important role. Because skin is not sterile, your skin needs to be as free of germs as possible. You can reduce the number of germs on your  skin by washing with CHG (chlorahexidine gluconate) Soap before surgery.  CHG is an antiseptic cleaner which kills germs and bonds with the skin to continue killing germs even after washing.  Please do not use if you have an allergy to CHG or antibacterial soaps. If your skin becomes reddened/irritated stop using the CHG.  Do not shave (including legs and underarms) for at least 48 hours prior to first CHG shower. It is OK to shave your face.  Please follow these instructions carefully.   1. Shower the NIGHT BEFORE SURGERY and the MORNING OF SURGERY with CHG.   2. If you chose to wash your hair, wash your hair first as usual with your normal shampoo.  3. After you shampoo, rinse your hair and body thoroughly to remove the shampoo.  4. Use CHG as you would any other liquid soap. You can apply CHG directly to the skin and wash gently with a scrungie or a clean washcloth.   5. Apply the CHG Soap to your body ONLY FROM THE NECK DOWN.  Do not use on open wounds or open sores. Avoid contact with your eyes, ears, mouth and genitals (private parts). Wash Face and genitals (private parts)  with your normal soap.  6. Wash thoroughly, paying special attention to the area where your surgery will be performed.  7. Thoroughly rinse your body with warm water from the neck down.  8. DO NOT shower/wash with your normal soap after using and rinsing off the CHG Soap.  9. [place]  yourself dry with a CLEAN TOWEL.  10. Wear CLEAN PAJAMAS to bed the night before surgery, wear comfortable clothes the morning of surgery  11. Place CLEAN SHEETS on your bed the night of your first shower and DO NOT SLEEP WITH PETS.  Day of Surgery: Do not apply any deodorants/lotions. Please wear clean clothes to the hospital/surgery center.    Please read over the following fact sheets that you were given. Pain Booklet, Coughing and Deep Breathing and Surgical Site Infection Prevention

## 2017-10-25 NOTE — Progress Notes (Signed)
Anesthesia Chart Review:  Pt is a 67 year old male scheduled for R inguinal hernia repair with mesh on 10/30/2016 with Rolm Bookbinder, M.D.  - PCP is Kathryne Eriksson, MD - Cardiologist is Mertie Moores, MD. Last office visit 03/22/17. Pt cleared for surgery by Bernerd Pho, PA 10/12/17  PMH includes: CAD (s/p stent to LAD 2009), hyperlipidemia. Never smoker. BMI 25.  Medications include: ASA 81 mg, Plavix, rosuvastatin, terazosin. Pt to hold plavix for 5 days before surgery.   BP 127/69   Pulse 81   Temp 36.6 C   Resp 18   Ht 5\' 10"  (1.778 m)   Wt 171 lb 14.4 oz (78 kg)   SpO2 100%   BMI 24.67 kg/m   Preoperative labs reviewed.    EKG 03/22/17: NSR  Cardiac cath 07/24/08:  - LM: smooth and normal. - LAD: minor luminal irregularities proximally.  The mid LAD has a 95%-99% stenosis. D1 is large and normal vessel.  D2 is moderate-sized and normal.  The distal LAD has minimal luminal  irregularities.  S/p stent to LAD - CX: moderate in size.  Fairly smooth and normal throughout its course.  Gives off moderate-sized OM which is normal. - RCA: has an anterior takeoff.  Large and dominant.  Minor luminal irregularities, no significant stenosis.  PDA and the posterolateral segment artery are normal.  If no changes, I anticipate pt can proceed with surgery as scheduled.   Willeen Cass, FNP-BC [facility] Short Stay Surgical Center/Anesthesiology Phone: [phone] 10/25/2017 4:20 PM

## 2017-10-25 NOTE — Pre-Procedure Instructions (Signed)
    Jacob Obrien  10/25/2017      [id number] - [place], [place] - [address] AT [address]. [place] [address] Phone: [phone] Fax: [phone]    Your procedure is scheduled on Monday, October 30, 2016  Report to [facility] Admitting at 9:00 A.M.  Call this number if you have problems the morning of surgery:  [phone]   Remember: Follow doctors instructions regarding Plavix and Aspirin  Do not eat food or drink liquids after midnight Sunday, October 29, 2017  Take these medicines the morning of surgery with A SIP OF WATER :finasteride (PROSCAR) if needed: Tylenol Stop taking vitamins, fish oil, Coenzyme Q 10,  Glucosamine-Chondroitin, and herbal medications. Do not take any NSAIDs ie: Ibuprofen, Advil, Naproxen (Aleve), Motrin, BC and Goody Powder; stop now.  Do not wear jewelry, make-up or nail polish.  Do not wear lotions, powders, or perfumes, or deodorant.  Do not shave 48 hours prior to surgery.  Men may shave face and neck.  Do not bring valuables to the hospital.  [facility] is not responsible for any belongings or valuables.  Contacts, dentures or bridgework may not be worn into surgery.  Leave your suitcase in the car.  After surgery it may be brought to your room. For patients admitted to the hospital, discharge time will be determined by your treatment team. Patients discharged the day of surgery will not be allowed to drive home.  Special instructions: Shower the night before surgery and the morning of surgery with CHG. Please read over the following fact sheets that you were given. Pain Booklet, Coughing and Deep Breathing and Surgical Site Infection Prevention

## 2017-10-30 ENCOUNTER — Other Ambulatory Visit: Payer: Self-pay

## 2017-10-30 ENCOUNTER — Encounter (HOSPITAL_COMMUNITY): Payer: Self-pay | Admitting: *Deleted

## 2017-10-30 ENCOUNTER — Ambulatory Visit (HOSPITAL_COMMUNITY): Payer: Medicare Other | Admitting: Emergency Medicine

## 2017-10-30 ENCOUNTER — Ambulatory Visit (HOSPITAL_COMMUNITY): Payer: Medicare Other | Admitting: Critical Care Medicine

## 2017-10-30 ENCOUNTER — Encounter (HOSPITAL_COMMUNITY)
Admission: RE | Disposition: A | Discharge status: Home / Self Care | Payer: Self-pay | Source: Ambulatory Visit | Attending: General Surgery

## 2017-10-30 ENCOUNTER — Ambulatory Visit (HOSPITAL_COMMUNITY)
Admission: RE | Admit: 2017-10-30 | Discharge: 2017-10-30 | Disposition: A | Discharge status: Home / Self Care | Payer: Medicare Other | Source: Ambulatory Visit | Attending: General Surgery | Admitting: General Surgery

## 2017-10-30 DIAGNOSIS — I252 Old myocardial infarction: Secondary | ICD-10-CM | POA: Diagnosis not present

## 2017-10-30 DIAGNOSIS — Z955 Presence of coronary angioplasty implant and graft: Secondary | ICD-10-CM | POA: Diagnosis not present

## 2017-10-30 DIAGNOSIS — I2510 Atherosclerotic heart disease of native coronary artery without angina pectoris: Secondary | ICD-10-CM | POA: Insufficient documentation

## 2017-10-30 DIAGNOSIS — Z7902 Long term (current) use of antithrombotics/antiplatelets: Secondary | ICD-10-CM | POA: Insufficient documentation

## 2017-10-30 DIAGNOSIS — K4090 Unilateral inguinal hernia, without obstruction or gangrene, not specified as recurrent: Secondary | ICD-10-CM | POA: Insufficient documentation

## 2017-10-30 HISTORY — PX: INGUINAL HERNIA REPAIR: SHX194

## 2017-10-30 HISTORY — PX: INSERTION OF MESH: SHX5868

## 2017-10-30 SURGERY — REPAIR, HERNIA, INGUINAL, ADULT
Anesthesia: General | Laterality: Right

## 2017-10-30 MED ORDER — ACETAMINOPHEN 500 MG PO TABS: 1000.0000 mg | ORAL_TABLET | ORAL | Status: DC

## 2017-10-30 MED ORDER — MIDAZOLAM HCL 2 MG/2ML IJ SOLN
INTRAMUSCULAR | Status: AC
  Administered 2017-10-30: 2 mg
  Filled 2017-10-30: qty 2

## 2017-10-30 MED ORDER — CEFAZOLIN SODIUM-DEXTROSE 2-4 GM/100ML-% IV SOLN
2.0000 g | INTRAVENOUS | Status: AC
  Administered 2017-10-30: 2 g via INTRAVENOUS

## 2017-10-30 MED ORDER — FENTANYL CITRATE (PF) 100 MCG/2ML IJ SOLN
INTRAMUSCULAR | Status: AC
  Administered 2017-10-30: 100 ug
  Filled 2017-10-30: qty 2

## 2017-10-30 MED ORDER — MIDAZOLAM HCL 2 MG/2ML IJ SOLN
INTRAMUSCULAR | Status: AC
  Filled 2017-10-30: qty 2

## 2017-10-30 MED ORDER — ONDANSETRON HCL 4 MG/2ML IJ SOLN
INTRAMUSCULAR | Status: AC
  Filled 2017-10-30: qty 2

## 2017-10-30 MED ORDER — DEXAMETHASONE SODIUM PHOSPHATE 10 MG/ML IJ SOLN
INTRAMUSCULAR | Status: DC | PRN
  Administered 2017-10-30: 4 mg via INTRAVENOUS

## 2017-10-30 MED ORDER — BUPIVACAINE HCL (PF) 0.25 % IJ SOLN
INTRAMUSCULAR | Status: AC
  Filled 2017-10-30: qty 30

## 2017-10-30 MED ORDER — SCOPOLAMINE 1 MG/3DAYS TD PT72
MEDICATED_PATCH | TRANSDERMAL | Status: AC
  Filled 2017-10-30: qty 1

## 2017-10-30 MED ORDER — LACTATED RINGERS IV SOLN
INTRAVENOUS | Status: DC
  Administered 2017-10-30: 09:00:00 via INTRAVENOUS

## 2017-10-30 MED ORDER — GABAPENTIN 300 MG PO CAPS
ORAL_CAPSULE | ORAL | Status: AC
  Administered 2017-10-30: 300 mg
  Filled 2017-10-30: qty 1

## 2017-10-30 MED ORDER — 0.9 % SODIUM CHLORIDE (POUR BTL) OPTIME
TOPICAL | Status: DC | PRN
  Administered 2017-10-30: 1000 mL

## 2017-10-30 MED ORDER — PHENYLEPHRINE HCL 10 MG/ML IJ SOLN
INTRAVENOUS | Status: DC | PRN
  Administered 2017-10-30: 50 ug/min via INTRAVENOUS

## 2017-10-30 MED ORDER — ENSURE PRE-SURGERY PO LIQD: 592.0000 mL | Freq: Once | ORAL | Status: DC

## 2017-10-30 MED ORDER — DEXAMETHASONE SODIUM PHOSPHATE 10 MG/ML IJ SOLN
INTRAMUSCULAR | Status: AC
  Filled 2017-10-30: qty 1

## 2017-10-30 MED ORDER — BUPIVACAINE HCL (PF) 0.25 % IJ SOLN
INTRAMUSCULAR | Status: DC | PRN
Start: 2017-10-30 — End: 2017-10-30
  Administered 2017-10-30: 5 mL

## 2017-10-30 MED ORDER — PROPOFOL 10 MG/ML IV BOLUS
INTRAVENOUS | Status: DC | PRN
  Administered 2017-10-30: 150 mg via INTRAVENOUS

## 2017-10-30 MED ORDER — OXYCODONE HCL 5 MG PO TABS
ORAL_TABLET | ORAL | Status: AC
  Filled 2017-10-30: qty 1

## 2017-10-30 MED ORDER — FENTANYL CITRATE (PF) 250 MCG/5ML IJ SOLN
INTRAMUSCULAR | Status: AC
  Filled 2017-10-30: qty 5

## 2017-10-30 MED ORDER — GABAPENTIN 300 MG PO CAPS: 300.0000 mg | ORAL_CAPSULE | ORAL | Status: DC

## 2017-10-30 MED ORDER — MIDAZOLAM HCL 2 MG/2ML IJ SOLN: 2.0000 mg | Freq: Once | INTRAMUSCULAR | Status: DC

## 2017-10-30 MED ORDER — LIDOCAINE 2% (20 MG/ML) 5 ML SYRINGE
INTRAMUSCULAR | Status: AC
  Filled 2017-10-30: qty 5

## 2017-10-30 MED ORDER — PHENYLEPHRINE 40 MCG/ML (10ML) SYRINGE FOR IV PUSH (FOR BLOOD PRESSURE SUPPORT)
PREFILLED_SYRINGE | INTRAVENOUS | Status: DC | PRN
  Administered 2017-10-30: 120 ug via INTRAVENOUS
  Administered 2017-10-30: 80 ug via INTRAVENOUS
  Administered 2017-10-30: 40 ug via INTRAVENOUS

## 2017-10-30 MED ORDER — OXYCODONE HCL 5 MG PO TABS: 5.0000 mg | ORAL_TABLET | Freq: Four times a day (QID) | ORAL | 0 refills | Status: DC | PRN

## 2017-10-30 MED ORDER — PHENYLEPHRINE 40 MCG/ML (10ML) SYRINGE FOR IV PUSH (FOR BLOOD PRESSURE SUPPORT)
PREFILLED_SYRINGE | INTRAVENOUS | Status: AC
  Filled 2017-10-30: qty 10

## 2017-10-30 MED ORDER — ACETAMINOPHEN 500 MG PO TABS
ORAL_TABLET | ORAL | Status: AC
  Administered 2017-10-30: 1000 mg
  Filled 2017-10-30: qty 2

## 2017-10-30 MED ORDER — CEFAZOLIN SODIUM-DEXTROSE 2-4 GM/100ML-% IV SOLN
INTRAVENOUS | Status: AC
  Filled 2017-10-30: qty 100

## 2017-10-30 MED ORDER — ROCURONIUM BROMIDE 10 MG/ML (PF) SYRINGE
PREFILLED_SYRINGE | INTRAVENOUS | Status: AC
  Filled 2017-10-30: qty 5

## 2017-10-30 MED ORDER — ONDANSETRON HCL 4 MG/2ML IJ SOLN
INTRAMUSCULAR | Status: DC | PRN
  Administered 2017-10-30: 4 mg via INTRAVENOUS

## 2017-10-30 MED ORDER — LIDOCAINE HCL (CARDIAC) 20 MG/ML IV SOLN
INTRAVENOUS | Status: DC | PRN
  Administered 2017-10-30: 100 mg via INTRAVENOUS

## 2017-10-30 MED ORDER — PROPOFOL 10 MG/ML IV BOLUS
INTRAVENOUS | Status: AC
  Filled 2017-10-30: qty 20

## 2017-10-30 MED ORDER — OXYCODONE HCL 5 MG PO TABS
5.0000 mg | ORAL_TABLET | ORAL | Status: DC | PRN
  Administered 2017-10-30: 5 mg via ORAL

## 2017-10-30 MED ORDER — FENTANYL CITRATE (PF) 100 MCG/2ML IJ SOLN
INTRAMUSCULAR | Status: DC | PRN
  Administered 2017-10-30: 25 ug via INTRAVENOUS

## 2017-10-30 MED ORDER — FENTANYL CITRATE (PF) 100 MCG/2ML IJ SOLN: 100.0000 ug | Freq: Once | INTRAMUSCULAR | Status: DC

## 2017-10-30 SURGICAL SUPPLY — 48 items
BLADE CLIPPER SURG (BLADE) IMPLANT
BLADE SURG 10 STRL SS (BLADE) ×3 IMPLANT
BLADE SURG 15 STRL LF DISP TIS (BLADE) ×1 IMPLANT
BLADE SURG 15 STRL SS (BLADE) ×2
CANISTER SUCT 3000ML PPV (MISCELLANEOUS) IMPLANT
CHLORAPREP W/TINT 26ML (MISCELLANEOUS) ×3 IMPLANT
COVER SURGICAL LIGHT HANDLE (MISCELLANEOUS) ×3 IMPLANT
DECANTER SPIKE VIAL GLASS SM (MISCELLANEOUS) IMPLANT
DERMABOND ADHESIVE PROPEN (GAUZE/BANDAGES/DRESSINGS) ×2
DERMABOND ADVANCED (GAUZE/BANDAGES/DRESSINGS) ×2
DERMABOND ADVANCED .7 DNX12 (GAUZE/BANDAGES/DRESSINGS) ×1 IMPLANT
DERMABOND ADVANCED .7 DNX6 (GAUZE/BANDAGES/DRESSINGS) ×1 IMPLANT
DRAIN PENROSE 1/2X12 LTX STRL (WOUND CARE) IMPLANT
DRAPE LAPAROTOMY TRNSV 102X78 (DRAPE) ×3 IMPLANT
DRAPE UTILITY XL STRL (DRAPES) ×3 IMPLANT
ELECT CAUTERY BLADE 6.4 (BLADE) ×3 IMPLANT
ELECT REM PT RETURN 9FT ADLT (ELECTROSURGICAL) ×3
ELECTRODE REM PT RTRN 9FT ADLT (ELECTROSURGICAL) ×1 IMPLANT
GAUZE SPONGE 4X4 16PLY XRAY LF (GAUZE/BANDAGES/DRESSINGS) ×3 IMPLANT
GLOVE BIO SURGEON STRL SZ7 (GLOVE) ×3 IMPLANT
GLOVE BIOGEL PI IND STRL 7.5 (GLOVE) ×1 IMPLANT
GLOVE BIOGEL PI INDICATOR 7.5 (GLOVE) ×2
GOWN STRL REUS W/ TWL LRG LVL3 (GOWN DISPOSABLE) ×2 IMPLANT
GOWN STRL REUS W/TWL LRG LVL3 (GOWN DISPOSABLE) ×4
KIT BASIN OR (CUSTOM PROCEDURE TRAY) ×3 IMPLANT
KIT ROOM TURNOVER OR (KITS) ×3 IMPLANT
MARKER SKIN DUAL TIP RULER LAB (MISCELLANEOUS) ×3 IMPLANT
MESH ULTRAPRO 3X6 7.6X15CM (Mesh General) ×3 IMPLANT
NEEDLE HYPO 25GX1X1/2 BEV (NEEDLE) ×3 IMPLANT
NS IRRIG 1000ML POUR BTL (IV SOLUTION) ×3 IMPLANT
PACK SURGICAL SETUP 50X90 (CUSTOM PROCEDURE TRAY) ×3 IMPLANT
PAD ARMBOARD 7.5X6 YLW CONV (MISCELLANEOUS) ×3 IMPLANT
PENCIL BUTTON HOLSTER BLD 10FT (ELECTRODE) ×3 IMPLANT
SPONGE LAP 18X18 X RAY DECT (DISPOSABLE) ×3 IMPLANT
SUT MNCRL AB 4-0 PS2 18 (SUTURE) ×3 IMPLANT
SUT VIC AB 0 CT1 18XCR BRD 8 (SUTURE) ×1 IMPLANT
SUT VIC AB 0 CT1 8-18 (SUTURE) ×2
SUT VIC AB 2-0 CT1 27 (SUTURE) ×4
SUT VIC AB 2-0 CT1 TAPERPNT 27 (SUTURE) ×2 IMPLANT
SUT VIC AB 2-0 SH 18 (SUTURE) ×6 IMPLANT
SUT VIC AB 3-0 SH 27 (SUTURE) ×2
SUT VIC AB 3-0 SH 27XBRD (SUTURE) ×1 IMPLANT
SUT VICRYL AB 2 0 TIES (SUTURE) ×3 IMPLANT
SYR CONTROL 10ML LL (SYRINGE) ×3 IMPLANT
TOWEL OR 17X24 6PK STRL BLUE (TOWEL DISPOSABLE) ×3 IMPLANT
TUBE CONNECTING 12'X1/4 (SUCTIONS)
TUBE CONNECTING 12X1/4 (SUCTIONS) IMPLANT
YANKAUER SUCT BULB TIP NO VENT (SUCTIONS) IMPLANT

## 2017-10-30 NOTE — Anesthesia Procedure Notes (Signed)
Procedure Name: LMA Insertion Date/Time: 10/30/2017 10:27 AM Performed by: Wilburn Cornelia, CRNA Pre-anesthesia Checklist: Patient identified, Emergency Drugs available, Suction available, Patient being monitored and Timeout performed Patient Re-evaluated:Patient Re-evaluated prior to induction Oxygen Delivery Method: Circle system utilized Preoxygenation: Pre-oxygenation with 100% oxygen Induction Type: IV induction LMA: LMA inserted LMA Size: 4.0 Number of attempts: 1 Placement Confirmation: positive ETCO2,  CO2 detector and breath sounds checked- equal and bilateral Tube secured with: Tape Dental Injury: Teeth and Oropharynx as per pre-operative assessment

## 2017-10-30 NOTE — H&P (Signed)
Jacob Obrien is an 67 y.o. male.   Chief Complaint: right inguinal hernia HPI: Jacob Obrien referred by Dr Kathryne Eriksson presents with several months of a right groin bulge. he is very active and notes this when he is riding bike or chopping wood. he has history lih at age 6 done. nonsmoker. has history of cad with stent in 2009 on plavix and asa. has history of bph and is on meds with some improvement   Past Medical History:  Diagnosis Date  . Coronary artery disease    status post PTCA and stenting of his LAD in October of 2009. He had placement of a 3.0x19mm Promus stent.   . Hyperlipidemia   . Myocardial infarction ([place])    2009  . Right groin hernia     Past Surgical History:  Procedure Laterality Date  . CORONARY ANGIOPLASTY WITH STENT PLACEMENT  07-2008   stenting of his left anterior descending artery. EF 50-55%. There is slighthypokinesis of the apex. The mid LAD has a 95%-99% stenosis with TIMI grade 2 flow down the LAD and diagonal vessel.  Marland Kitchen HERNIA REPAIR     Left groin at age 1  . VASECTOMY      Family History  Problem Relation Age of Onset  . Angina Mother   . CAD Father    Social History:  reports that  has never smoked. he has never used smokeless tobacco. He reports that he drinks alcohol. He reports that he does not use drugs.  Allergies: No Known Allergies  No medications prior to admission.    No results found for this or any previous visit (from the past 48 hour(s)). No results found.  ROS Review of Systems (Armen Glenn CMA; 10/06/2017 1:40 PM) General Not Present- Appetite Loss, Chills, Fatigue, Fever, Night Sweats, Weight Gain and Weight Loss. Skin Not Present- Change in Wart/Mole, Dryness, Hives, Jaundice, New Lesions, Non-Healing Wounds, Rash and Ulcer. HEENT Present- Hearing Loss, Ringing in the Ears, Seasonal Allergies and Wears glasses/contact lenses. Not Present- Earache, Hoarseness, Nose Bleed, Oral Ulcers, Sinus Pain, Sore Throat, Visual  Disturbances and Yellow Eyes. Respiratory Not Present- Bloody sputum, Chronic Cough, Difficulty Breathing, Snoring and Wheezing. Breast Not Present- Breast Mass, Breast Pain, Nipple Discharge and Skin Changes. Cardiovascular Not Present- Chest Pain, Difficulty Breathing Lying Down, Leg Cramps, Palpitations, Rapid Heart Rate, Shortness of Breath and Swelling of Extremities. Gastrointestinal Not Present- Abdominal Pain, Bloating, Bloody Stool, Change in Bowel Habits, Chronic diarrhea, Constipation, Difficulty Swallowing, Excessive gas, Gets full quickly at meals, Hemorrhoids, Indigestion, Nausea, Rectal Pain and Vomiting. Male Genitourinary Not Present- Blood in Urine, Change in Urinary Stream, Frequency, Impotence, Nocturia, Painful Urination, Urgency and Urine Leakage. Musculoskeletal Present- Joint Stiffness and Muscle Pain. Not Present- Back Pain, Joint Pain, Muscle Weakness and Swelling of Extremities. Neurological Not Present- Decreased Memory, Fainting, Headaches, Numbness, Seizures, Tingling, Tremor, Trouble walking and Weakness. Psychiatric Not Present- Anxiety, Bipolar, Change in Sleep Pattern, Depression, Fearful and Frequent crying. Endocrine Not Present- Cold Intolerance, Excessive Hunger, Hair Changes, Heat Intolerance, Hot flashes and New Diabetes. Hematology Present- Blood Thinners and Easy Bruising. Not Present- Excessive bleeding, Gland problems, HIV and Persistent Infections.  There were no vitals taken for this visit. Physical Exam   Vitals (Armen Glenn CMA; 10/06/2017 1:41 PM) 10/06/2017 1:41 PM Weight: 171 lb Height: 70in Body Surface Area: 1.95 m Body Mass Index: 24.54 kg/m  Temp.: 98.41F  Pulse: 87 (Regular)  P.OX: 98% (Room air) BP: 120/Jacob (Sitting, Left Arm, Standard) Physical Exam (  Rolm Bookbinder MD; 10/06/2017 2:06 PM) General Mental Status-Alert. Orientation-Oriented X3. Chest and Lung Exam Chest and lung exam reveals -quiet, even and easy  respiratory effort with no use of accessory muscles. Cardiovascular Cardiovascular examination reveals -normal heart sounds, regular rate and rhythm with no murmurs. Abdomen Note: soft nt/nd no uh right inguinal hernia difficult to reduce nontender no lih   Assessment/Plan RIGHT INGUINAL HERNIA (K40.90) Story: RIH repair with mesh We discussed observation versus repair. We discussed both laparoscopic and open inguinal hernia repairs. Goals should be achieved with surgery. We discussed the usage of mesh and the rationale behind that. We went over the pathophysiology of inguinal hernia. We have elected to perform open inguinal hernia repair with mesh. We discussed the risks including bleeding, infection, recurrence, postoperative pain and chronic groin pain, testicular injury, urinary retention, numbness in groin and around incision.    Rolm Bookbinder, MD 10/30/2017, 7:09 AM

## 2017-10-30 NOTE — Anesthesia Preprocedure Evaluation (Signed)
Anesthesia Evaluation  Patient identified by MRN, date of birth, ID band Patient awake    Reviewed: Allergy & Precautions, NPO status , Patient's Chart, lab work & pertinent test results  Airway Mallampati: I  TM Distance: >3 FB Neck ROM: Full    Dental   Pulmonary    Pulmonary exam normal        Cardiovascular + CAD, + Past MI and + Cardiac Stents  Normal cardiovascular exam     Neuro/Psych    GI/Hepatic   Endo/Other    Renal/GU      Musculoskeletal   Abdominal   Peds  Hematology   Anesthesia Other Findings   Reproductive/Obstetrics                             Anesthesia Physical Anesthesia Plan  ASA: III  Anesthesia Plan: General   Post-op Pain Management:  Regional for Post-op pain   Induction: Intravenous  PONV Risk Score and Plan: 2  Airway Management Planned: LMA  Additional Equipment:   Intra-op Plan:   Post-operative Plan: Extubation in OR  Informed Consent: I have reviewed the patients History and Physical, chart, labs and discussed the procedure including the risks, benefits and alternatives for the proposed anesthesia with the patient or authorized representative who has indicated his/her understanding and acceptance.     Plan Discussed with: CRNA and Surgeon  Anesthesia Plan Comments:         Anesthesia Quick Evaluation

## 2017-10-30 NOTE — Discharge Instructions (Signed)
[facility] ° °UMBILICAL OR INGUINAL HERNIA REPAIR: POST OP INSTRUCTIONS ° °Always review your discharge instruction sheet given to you by the facility where your surgery was performed. °IF YOU HAVE DISABILITY OR FAMILY LEAVE FORMS, YOU MUST BRING THEM TO THE OFFICE FOR PROCESSING.   °DO NOT GIVE THEM TO YOUR DOCTOR. ° °1. A  prescription for pain medication may be given to you upon discharge.  Take your pain medication as prescribed, if needed.  If narcotic pain medicine is not needed, then you may take acetaminophen (Tylenol), naprosyn (Alleve) or ibuprofen (Advil) as needed. °2. Take your usually prescribed medications unless otherwise directed. °3. If you need a refill on your pain medication, please contact your pharmacy.  They will contact our office to request authorization. Prescriptions will not be filled after 5 pm or on week-ends. °4. You should follow a light diet the first 24 hours after arrival home, such as soup and crackers, etc.  Be sure to include lots of fluids daily.  Resume your normal diet the day after surgery. °5. Most patients will experience some swelling and bruising around the umbilicus or in the groin and scrotum.  Ice packs and reclining will help.  Swelling and bruising can take several days to resolve.  °6. It is common to experience some constipation if taking pain medication after surgery.  Increasing fluid intake and taking a stool softener (such as Colace) will usually help or prevent this problem from occurring.  A mild laxative (Milk of Magnesia or Miralax) should be taken according to package directions if there are no bowel movements after 48 hours. °7. Unless discharge instructions indicate otherwise, you may remove your bandages 48 hours after surgery, and you may shower at that time.  You may have steri-strips (small skin tapes) in place directly over the incision.  These strips should be left on the skin for 7-10 days and will come off on their own.   If your surgeon used skin glue on the incision, you may shower in 24 hours.  The glue will flake off over the next 2-3 weeks.  Any sutures or staples will be removed at the office during your follow-up visit. °8. ACTIVITIES:  You may resume regular (light) daily activities beginning the next day--such as daily self-care, walking, climbing stairs--gradually increasing activities as tolerated.  You may have sexual intercourse when it is comfortable.  Refrain from any heavy lifting or straining until approved by your doctor. °a. You may drive when you are no longer taking prescription pain medication, you can comfortably wear a seatbelt, and you can safely maneuver your car and apply brakes. °b. RETURN TO WORK:  __________________________________________________________ °9. You should see your doctor in the office for a follow-up appointment approximately 2-3 weeks after your surgery.  Make sure that you call for this appointment within a day or two after you arrive home to insure a convenient appointment time. °10. OTHER INSTRUCTIONS:  __________________________________________________________________________________________________________________________________________________________________________________________  °WHEN TO CALL YOUR DOCTOR: °1. Fever over 101.0 °2. Inability to urinate °3. Nausea and/or vomiting °4. Extreme swelling or bruising °5. Continued bleeding from incision. °6. Increased pain, redness, or drainage from the incision ° °The clinic staff is available to answer your questions during regular business hours.  Please don’t hesitate to call and ask to speak to one of the nurses for clinical concerns.  If you have a medical emergency, go to the nearest emergency room or call 911.  A surgeon from [facility]   is always on call at the hospital ° ° °[address], [address], [place], [place]  [address] ? ° [address] [address], [place], [place]   [address] °[phone] ? [phone] ? FAX  [phone] °Web site: [email or web address] ° ° °

## 2017-10-30 NOTE — Anesthesia Postprocedure Evaluation (Signed)
Anesthesia Post Note  Patient: Jacob Obrien  Procedure(s) Performed: HERNIA REPAIR INGUINAL ADULT WITH MESH (Right ) INSERTION OF MESH (Right )     Patient location during evaluation: PACU Anesthesia Type: General Level of consciousness: awake and alert Pain management: pain level controlled Vital Signs Assessment: post-procedure vital signs reviewed and stable Respiratory status: spontaneous breathing, nonlabored ventilation, respiratory function stable and patient connected to nasal cannula oxygen Cardiovascular status: blood pressure returned to baseline and stable Postop Assessment: no apparent nausea or vomiting Anesthetic complications: no    Last Vitals:  Vitals:   10/30/17 1230 10/30/17 1300  BP: (!) 141/90 133/87  Pulse: 60 60  Resp: 14 16  Temp:    SpO2: 100% 100%    Last Pain:  Vitals:   10/30/17 1300  TempSrc:   PainSc: 2                  Khalib Fendley DAVID

## 2017-10-30 NOTE — Op Note (Signed)
Preoperative diagnosis: right inguinal hernia Postoperative diagnosis: pantaloon right inguinal hernia Procedure: Right inguinal hernia repair with ultrapro mesh Surgeon: Dr Serita Grammes EBL: minimal Anesthesia: general with tap block Specimens none Drains none Complications none Sponge and needle count correct times two dispo to recovery stable  Indications: This is a 30 yom who presents with symptomatic right inguinal hernia. We had been doing watchful waiting but it is causing him to not be able to do daily activities. We discussed open repair with mesh.  Procedure: After informed consent obtained, patient was taken to the operating room. He had undergone a TAP block. He was given antibiotics. He had SCDs in place. He was placed under general anesthesia. He was prepped and draped in the standard sterile surgical fashion. A timeout was performed.   I infiltrated marcaine in the skin. I made a right groin incision and carried this down to the external oblique. I ligated the superficial epigastric vein. I opened the the external oblique and encircled the spermatic cord. He had a moderate indirect hernia. He also had a very weak floor with a direct hernia present.  I  then reduced the indirect sac in its entirety.I then closed the internal ring with 2-0 vicryl suture. I elected toplace an ultrapro patch over the floor. I made a t cut and wrapped this around the spermatic cord. I sutured this to the pubic tubercle with 2-0 vicryl and every half cm to the inguinal ligament. I then sutured the t cut together. The mesh laid flat and was in good position. I laid the lateral portion flat under the external oblique.  It completely obliterated the defect. Hemostasis was observed. I then closed the external oblique with 2-0 vicryl and Scarpas fascia with 3-0 vicryl The skin was closed with 4-0 monocryl and dermabond. He tolerated well and was transferred to recovery stable.

## 2017-10-30 NOTE — Transfer of Care (Signed)
Immediate Anesthesia Transfer of Care Note  Patient: Jacob Obrien  Procedure(s) Performed: HERNIA REPAIR INGUINAL ADULT WITH MESH (Right ) INSERTION OF MESH (Right )  Patient Location: PACU  Anesthesia Type:General  Level of Consciousness: awake, alert  and oriented  Airway & Oxygen Therapy: Patient Spontanous Breathing and Patient connected to nasal cannula oxygen  Post-op Assessment: Report given to RN, Post -op Vital signs reviewed and stable and Patient moving all extremities X 4  Post vital signs: Reviewed and stable  Last Vitals:  Vitals:   10/30/17 1015 10/30/17 1132  BP:    Pulse: 64   Resp: (!) 9   Temp:  (P) 36.5 C  SpO2: 97%     Last Pain:  Vitals:   10/30/17 0856  TempSrc: Oral      Patients Stated Pain Goal: 3 (75/64/33 2951)  Complications: No apparent anesthesia complications

## 2017-10-31 ENCOUNTER — Encounter (HOSPITAL_COMMUNITY): Payer: Self-pay | Admitting: General Surgery

## 2018-01-16 ENCOUNTER — Other Ambulatory Visit: Payer: Self-pay | Admitting: Cardiovascular Disease

## 2018-03-21 ENCOUNTER — Other Ambulatory Visit: Payer: Self-pay | Admitting: Cardiovascular Disease

## 2018-04-16 ENCOUNTER — Ambulatory Visit (INDEPENDENT_AMBULATORY_CARE_PROVIDER_SITE_OTHER): Payer: Medicare Other | Admitting: Cardiovascular Disease

## 2018-04-16 ENCOUNTER — Encounter: Payer: Self-pay | Admitting: Cardiovascular Disease

## 2018-04-16 VITALS — BP 116/80 | HR 72 | Resp 16 | Ht 70.0 in | Wt 163.0 lb

## 2018-04-16 DIAGNOSIS — I25119 Atherosclerotic heart disease of native coronary artery with unspecified angina pectoris: Secondary | ICD-10-CM

## 2018-04-16 DIAGNOSIS — E782 Mixed hyperlipidemia: Secondary | ICD-10-CM | POA: Diagnosis not present

## 2018-04-16 LAB — LIPID PANEL
Chol/HDL Ratio: 2 ratio (ref 0.0–5.0)
Cholesterol, Total: 145 mg/dL (ref 100–199)
HDL: 72 mg/dL (ref >39)
LDL Calculated: 60 mg/dL (ref 0–99)
Triglycerides: 64 mg/dL (ref 0–149)
VLDL CHOLESTEROL CAL: 13 mg/dL (ref 5–40)

## 2018-04-16 LAB — BASIC METABOLIC PANEL
BUN / CREAT RATIO: 23 (ref 10–24)
BUN: 21 mg/dL (ref 8–27)
CHLORIDE: 105 mmol/L (ref 96–106)
CO2: 23 mmol/L (ref 20–29)
Calcium: 9.1 mg/dL (ref 8.6–10.2)
Creatinine, Ser: 0.93 mg/dL (ref 0.76–1.27)
GFR calc Af Amer: 97 mL/min/{1.73_m2} (ref >59)
GFR calc non Af Amer: 84 mL/min/{1.73_m2} (ref >59)
GLUCOSE: 94 mg/dL (ref 65–99)
Potassium: 4.4 mmol/L (ref 3.5–5.2)
SODIUM: 139 mmol/L (ref 134–144)

## 2018-04-16 LAB — HEPATIC FUNCTION PANEL
ALT: 20 IU/L (ref 0–44)
AST: 22 IU/L (ref 0–40)
Albumin: 4.2 g/dL (ref 3.6–4.8)
Alkaline Phosphatase: 51 IU/L (ref 39–117)
Bilirubin Total: 0.7 mg/dL (ref 0.0–1.2)
Bilirubin, Direct: 0.22 mg/dL (ref 0.00–0.40)
Total Protein: 6.2 g/dL (ref 6.0–8.5)

## 2018-04-16 NOTE — Patient Instructions (Signed)
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
## 2018-04-17 ENCOUNTER — Encounter: Payer: Self-pay | Admitting: Cardiovascular Disease

## 2018-04-17 ENCOUNTER — Telehealth: Payer: Self-pay | Admitting: Nurse Practitioner

## 2018-04-17 DIAGNOSIS — E782 Mixed hyperlipidemia: Secondary | ICD-10-CM

## 2018-04-17 MED ORDER — ROSUVASTATIN CALCIUM 10 MG PO TABS: 10.0000 mg | ORAL_TABLET | ORAL | 3 refills | Status: DC

## 2018-04-17 NOTE — Telephone Encounter (Signed)
Reviewed results with patient who verbalized understanding and agreement to decrease Rosuvastatin to 10 mg every other day (he had previously taken 10 mg daily). He is scheduled for repeat lab appointment on 10/1. I advised him to call back sooner with questions or concerns and he thanked me for the call.

## 2018-04-17 NOTE — Telephone Encounter (Signed)
-----   Message from Thayer Headings, MD sent at 04/16/2018  4:45 PM EDT ----- Jacob Obrien labs look great. He would like to try a lower dose statin Lets decrease the Rosuvastatin to 5 mg a day  Recheck labs in 3 month s

## 2018-06-01 ENCOUNTER — Other Ambulatory Visit: Payer: Self-pay | Admitting: Cardiovascular Disease

## 2018-07-17 ENCOUNTER — Other Ambulatory Visit: Payer: Medicare Other

## 2018-07-17 DIAGNOSIS — E782 Mixed hyperlipidemia: Secondary | ICD-10-CM

## 2018-07-17 LAB — BASIC METABOLIC PANEL
BUN / CREAT RATIO: 26 — AB (ref 10–24)
BUN: 25 mg/dL (ref 8–27)
CHLORIDE: 102 mmol/L (ref 96–106)
CO2: 24 mmol/L (ref 20–29)
Calcium: 9.3 mg/dL (ref 8.6–10.2)
Creatinine, Ser: 0.97 mg/dL (ref 0.76–1.27)
GFR, EST AFRICAN AMERICAN: 92 mL/min/{1.73_m2} (ref >59)
GFR, EST NON AFRICAN AMERICAN: 80 mL/min/{1.73_m2} (ref >59)
Glucose: 107 mg/dL — ABNORMAL HIGH (ref 65–99)
POTASSIUM: 4 mmol/L (ref 3.5–5.2)
Sodium: 142 mmol/L (ref 134–144)

## 2018-07-17 LAB — LIPID PANEL
Chol/HDL Ratio: 2.3 ratio (ref 0.0–5.0)
Cholesterol, Total: 169 mg/dL (ref 100–199)
HDL: 74 mg/dL (ref >39)
LDL Calculated: 80 mg/dL (ref 0–99)
Triglycerides: 75 mg/dL (ref 0–149)
VLDL Cholesterol Cal: 15 mg/dL (ref 5–40)

## 2018-07-17 LAB — HEPATIC FUNCTION PANEL
ALBUMIN: 4.4 g/dL (ref 3.6–4.8)
ALT: 20 IU/L (ref 0–44)
AST: 24 IU/L (ref 0–40)
Alkaline Phosphatase: 50 IU/L (ref 39–117)
BILIRUBIN TOTAL: 0.9 mg/dL (ref 0.0–1.2)
BILIRUBIN, DIRECT: 0.22 mg/dL (ref 0.00–0.40)
Total Protein: 6.3 g/dL (ref 6.0–8.5)

## 2018-07-18 ENCOUNTER — Other Ambulatory Visit: Payer: Self-pay | Admitting: Nurse Practitioner

## 2018-07-18 MED ORDER — ROSUVASTATIN CALCIUM 10 MG PO TABS: 10.0000 mg | ORAL_TABLET | ORAL | 3 refills | Status: DC

## 2019-05-14 ENCOUNTER — Other Ambulatory Visit: Payer: Self-pay | Admitting: Cardiovascular Disease

## 2019-11-06 ENCOUNTER — Ambulatory Visit: Payer: Medicare Other | Attending: Internal Medicine

## 2019-11-06 DIAGNOSIS — Z23 Encounter for immunization: Secondary | ICD-10-CM | POA: Insufficient documentation

## 2019-11-06 NOTE — Progress Notes (Signed)
   Covid-19 Vaccination Clinic  Name:  Jacob Obrien    MRN: [id number] DOB: 11-29-1950  11/06/2019  [name] was observed post Covid-19 immunization for 15 minutes without incidence. He was provided with Vaccine Information Sheet and instruction to access the V-Safe system.   [name] was instructed to call 911 with any severe reactions post vaccine: Marland Kitchen Difficulty breathing  . Swelling of your face and throat  . A fast heartbeat  . A bad rash all over your body  . Dizziness and weakness    Immunizations Administered    Name Date Dose VIS Date Route   Pfizer COVID-19 Vaccine 11/06/2019  1:43 PM 0.3 mL 09/27/2019 Intramuscular   Manufacturer: [place]   Lot: [id number]   [place]: [id number]

## 2019-11-19 ENCOUNTER — Ambulatory Visit: Payer: Medicare Other

## 2019-11-20 ENCOUNTER — Ambulatory Visit: Payer: Medicare Other

## 2019-11-27 ENCOUNTER — Ambulatory Visit: Payer: Medicare Other | Attending: Internal Medicine

## 2019-11-27 DIAGNOSIS — Z23 Encounter for immunization: Secondary | ICD-10-CM

## 2019-11-27 NOTE — Progress Notes (Signed)
   Covid-19 Vaccination Clinic  Name:  Jacob Obrien    MRN: [id number] DOB: June 06, 1951  11/27/2019  [name] was observed post Covid-19 immunization for 15 minutes without incidence. He was provided with Vaccine Information Sheet and instruction to access the V-Safe system.   Mr. Rising was instructed to call 911 with any severe reactions post vaccine: Marland Kitchen Difficulty breathing  . Swelling of your face and throat  . A fast heartbeat  . A bad rash all over your body  . Dizziness and weakness    Immunizations Administered    Name Date Dose VIS Date Route   Pfizer COVID-19 Vaccine 11/27/2019  8:35 AM 0.3 mL 09/27/2019 Intramuscular   Manufacturer: [place]   Lot: [id number]   [place]: [id number]

## 2019-11-27 NOTE — Progress Notes (Signed)
co

## 2020-06-23 NOTE — Progress Notes (Signed)
Virtual Visit via Telephone Note   This visit type was conducted due to national recommendations for restrictions regarding the COVID-19 Pandemic (e.g. social distancing) in an effort to limit this patient's exposure and mitigate transmission in our community.  Due to his co-morbid illnesses, this patient is at least at moderate risk for complications without adequate follow up.  This format is felt to be most appropriate for this patient at this time.  The patient did not have access to video technology/had technical difficulties with video requiring transitioning to audio format only (telephone).  All issues noted in this document were discussed and addressed.  No physical exam could be performed with this format.  Please refer to the patient's chart for his  consent to telehealth for [facility].    Date:  06/24/2020   ID:  Jacob Obrien, DOB 1951-01-01, MRN [id number] The patient was identified using 2 identifiers.  Patient Location: Home Provider Location: Home Office  PCP:  Christain Sacramento, MD  Cardiologist:  Mertie Moores, MD   Evaluation Performed:  Follow-Up Visit  Chief Complaint:  2 year follow up  History of Present Illness:    Jacob Obrien is a 69 y.o. male with with history of coronary artery disease s/p DES to LAD October 2009, and hyperlipidemia presents for long due follow-up.  Last seen by Dr. Acie Fredrickson July 2019.  Seen virtually for follow-up.  Patient is very active.  He walks 5 miles per day.  He rides bicycle and swims as well.  No exertional symptoms.  He has done well since his PCI.  No chest pain, shortness of breath, orthopnea, PND, syncope, lower extremity edema or melena.   The patient does not have symptoms concerning for COVID-19 infection (fever, chills, cough, or new shortness of breath).    Past Medical History:  Diagnosis Date   Coronary artery disease    status post PTCA and stenting of his LAD in October of 2009. He had placement of a  3.0x27mm Promus stent.    Hyperlipidemia    Myocardial infarction [facility])    2009   Right groin hernia    Past Surgical History:  Procedure Laterality Date   CORONARY ANGIOPLASTY WITH STENT PLACEMENT  07-2008   stenting of his left anterior descending artery. EF 50-55%. There is slighthypokinesis of the apex. The mid LAD has a 95%-99% stenosis with TIMI grade 2 flow down the LAD and diagonal vessel.   HERNIA REPAIR     Left groin at age 74   INGUINAL HERNIA REPAIR Right 10/30/2017   Procedure: HERNIA REPAIR INGUINAL ADULT WITH MESH;  Surgeon: Rolm Bookbinder, MD;  Location: [facility];  Service: General;  Laterality: Right;   INSERTION OF MESH Right 10/30/2017   Procedure: INSERTION OF MESH;  Surgeon: Rolm Bookbinder, MD;  Location: [facility];  Service: General;  Laterality: Right;   VASECTOMY       Current Meds  Medication Sig   acetaminophen (TYLENOL) 500 MG tablet Take 1,000 mg by mouth every 6 (six) hours as needed (sore throat / cold symptoms).   Ascorbic Acid (VITAMIN C PO) Take 1 tablet by mouth daily.   aspirin 81 MG tablet Take 81 mg by mouth daily.    Cholecalciferol (VITAMIN D PO) Take 1 capsule by mouth daily.   clopidogrel (PLAVIX) 75 MG tablet TAKE 1 TABLET EVERY DAY   Coenzyme Q10-Vitamin E (QUNOL ULTRA COQ10) 100-150 MG-UNIT CAPS Take 1 tablet by mouth 2 (two) times daily.  Cyanocobalamin (B-12 PO) Take 1 tablet by mouth daily.    finasteride (PROSCAR) 5 MG tablet Take 5 mg by mouth daily.   Glucosamine-Chondroitin (GLUCOSAMINE CHONDR COMPLEX PO) Take 1 tablet by mouth 2 (two) times daily.    Multiple Vitamins-Minerals (MULTIVITAMIN PO) Take 1 tablet by mouth daily.    rosuvastatin (CRESTOR) 10 MG tablet Take 1 tablet (10 mg total) by mouth every other day.   terazosin (HYTRIN) 5 MG capsule Take 5 mg by mouth every evening.      Allergies:   Patient has no known allergies.   Social History   Tobacco Use   Smoking status: Never Smoker   Smokeless  tobacco: Never Used  Vaping Use   Vaping Use: Never used  Substance Use Topics   Alcohol use: Yes    Comment: Rarely   Drug use: No     Family Hx: The patient's family history includes Angina in his mother; CAD in his father.  ROS:   Please see the history of present illness.    All other systems reviewed and are negative.   Prior CV studies:   The following studies were reviewed today:  As above  Labs/Other Tests and Data Reviewed:    EKG:  No ECG reviewed.  Recent Labs: No results found for requested labs within last 8760 hours.   Recent Lipid Panel Lab Results  Component Value Date/Time   CHOL 169 07/17/2018 07:40 AM   TRIG 75 07/17/2018 07:40 AM   HDL 74 07/17/2018 07:40 AM   CHOLHDL 2.3 07/17/2018 07:40 AM   CHOLHDL 1.8 05/25/2016 08:02 AM   LDLCALC 80 07/17/2018 07:40 AM    Wt Readings from Last 3 Encounters:  06/24/20 170 lb (77.1 kg)  04/17/18 163 lb (73.9 kg)  10/25/17 171 lb 14.4 oz (78 kg)     Objective:    Vital Signs:  Ht 5\' 9"  (1.753 m)    Wt 170 lb (77.1 kg)    BMI 25.10 kg/m     VITAL SIGNS:  reviewed GEN:  no acute distress PSYCH:  normal affect  ASSESSMENT & PLAN:    1.  CAD s/p remote stenting No angina.  He is remained on dual antiplatelet therapy with aspirin and Plavix.  Continue statin.  2.  Hyperlipidemia -He is due for his lab. -Check lipid panel and LFTs.  COVID-19 Education: The signs and symptoms of COVID-19 were discussed with the patient and how to seek care for testing (follow up with PCP or arrange E-visit).  The importance of social distancing was discussed today.  Time:   Today, I have spent 6 minutes with the patient with telehealth technology discussing the above problems.     Medication Adjustments/Labs and Tests Ordered: Current medicines are reviewed at length with the patient today.  Concerns regarding medicines are outlined above.   Tests Ordered: Orders Placed This Encounter  Procedures   Lipid  panel   Hepatic function panel    Medication Changes: No orders of the defined types were placed in this encounter.   Follow Up:  In Person in 1 year(s)  Signed, Leanor Kail, [place]  06/24/2020 10:04 AM    [place]

## 2020-06-24 ENCOUNTER — Encounter: Payer: Self-pay | Admitting: Physician Assistant

## 2020-06-24 ENCOUNTER — Telehealth (INDEPENDENT_AMBULATORY_CARE_PROVIDER_SITE_OTHER): Payer: Medicare Other | Admitting: Physician Assistant

## 2020-06-24 ENCOUNTER — Other Ambulatory Visit: Payer: Self-pay

## 2020-06-24 ENCOUNTER — Telehealth: Payer: Self-pay | Admitting: *Deleted

## 2020-06-24 VITALS — Ht 69.0 in | Wt 170.0 lb

## 2020-06-24 DIAGNOSIS — E782 Mixed hyperlipidemia: Secondary | ICD-10-CM

## 2020-06-24 DIAGNOSIS — I2510 Atherosclerotic heart disease of native coronary artery without angina pectoris: Secondary | ICD-10-CM

## 2020-06-24 NOTE — Patient Instructions (Signed)
Medication Instructions:  Your physician recommends that you continue on your current medications as directed. Please refer to the Current Medication list given to you today.  *If you need a refill on your cardiac medications before your next appointment, please call your pharmacy*   Lab Work: 06/29/20:  COME TO THE OFFICE [facility] FOR FASTING LABS:  LIPID & LFT  If you have labs (blood work) drawn today and your tests are completely normal, you will receive your results only by: Marland Kitchen MyChart Message (if you have MyChart) OR . A paper copy in the mail If you have any lab test that is abnormal or we need to change your treatment, we will call you to review the results.   Testing/Procedures: None ordered   Follow-Up: At [facility], you and your health needs are our priority.  As part of our continuing mission to provide you with exceptional heart care, we have created designated Provider Care Teams.  These Care Teams include your primary Cardiologist (physician) and Advanced Practice Providers (APPs -  Physician Assistants and Nurse Practitioners) who all work together to provide you with the care you need, when you need it.  We recommend signing up for the patient portal called "MyChart".  Sign up information is provided on this After Visit Summary.  MyChart is used to connect with patients for Virtual Visits (Telemedicine).  Patients are able to view lab/test results, encounter notes, upcoming appointments, etc.  Non-urgent messages can be sent to your provider as well.   To learn more about what you can do with MyChart, go to [email or web address].    Your next appointment:   12 month(s)  The format for your next appointment:   In Person  Provider:   You may see Mertie Moores, MD or one of the following Advanced Practice Providers on your designated Care Team:    Richardson Dopp, PA-C  Robbie Lis, [place]    Other Instructions

## 2020-06-24 NOTE — Telephone Encounter (Signed)
°  Patient Consent for Virtual Visit         Jacob Obrien has provided verbal consent on 06/24/2020 for a virtual visit (video or telephone).   CONSENT FOR VIRTUAL VISIT FOR:  Jacob Obrien  By participating in this virtual visit I agree to the following:  I hereby voluntarily request, consent and authorize [facility] and its employed or contracted physicians, physician assistants, nurse practitioners or other licensed health care professionals (the Practitioner), to provide me with telemedicine health care services (the Services") as deemed necessary by the treating Practitioner. I acknowledge and consent to receive the Services by the Practitioner via telemedicine. I understand that the telemedicine visit will involve communicating with the Practitioner through live audiovisual communication technology and the disclosure of certain medical information by electronic transmission. I acknowledge that I have been given the opportunity to request an in-person assessment or other available alternative prior to the telemedicine visit and am voluntarily participating in the telemedicine visit.  I understand that I have the right to withhold or withdraw my consent to the use of telemedicine in the course of my care at any time, without affecting my right to future care or treatment, and that the Practitioner or I may terminate the telemedicine visit at any time. I understand that I have the right to inspect all information obtained and/or recorded in the course of the telemedicine visit and may receive copies of available information for a reasonable fee.  I understand that some of the potential risks of receiving the Services via telemedicine include:   Delay or interruption in medical evaluation due to technological equipment failure or disruption;  Information transmitted may not be sufficient (e.g. poor resolution of images) to allow for appropriate medical decision making by the  Practitioner; and/or   In rare instances, security protocols could fail, causing a breach of personal health information.  Furthermore, I acknowledge that it is my responsibility to provide information about my medical history, conditions and care that is complete and accurate to the best of my ability. I acknowledge that Practitioner's advice, recommendations, and/or decision may be based on factors not within their control, such as incomplete or inaccurate data provided by me or distortions of diagnostic images or specimens that may result from electronic transmissions. I understand that the practice of medicine is not an exact science and that Practitioner makes no warranties or guarantees regarding treatment outcomes. I acknowledge that a copy of this consent can be made available to me via my patient portal ([place]), or I can request a printed copy by calling the office of [facility].    I understand that my insurance will be billed for this visit.   I have read or had this consent read to me.  I understand the contents of this consent, which adequately explains the benefits and risks of the Services being provided via telemedicine.   I have been provided ample opportunity to ask questions regarding this consent and the Services and have had my questions answered to my satisfaction.  I give my informed consent for the services to be provided through the use of telemedicine in my medical care

## 2020-06-29 ENCOUNTER — Other Ambulatory Visit: Payer: Medicare Other | Admitting: *Deleted

## 2020-06-29 ENCOUNTER — Other Ambulatory Visit: Payer: Self-pay

## 2020-06-29 DIAGNOSIS — I2510 Atherosclerotic heart disease of native coronary artery without angina pectoris: Secondary | ICD-10-CM

## 2020-06-29 DIAGNOSIS — E782 Mixed hyperlipidemia: Secondary | ICD-10-CM

## 2020-06-29 LAB — HEPATIC FUNCTION PANEL
ALT: 22 IU/L (ref 0–44)
AST: 19 IU/L (ref 0–40)
Albumin: 4.5 g/dL (ref 3.8–4.8)
Alkaline Phosphatase: 57 IU/L (ref 44–121)
Bilirubin Total: 0.8 mg/dL (ref 0.0–1.2)
Bilirubin, Direct: 0.22 mg/dL (ref 0.00–0.40)
Total Protein: 6.4 g/dL (ref 6.0–8.5)

## 2020-06-29 LAB — LIPID PANEL
Chol/HDL Ratio: 2.2 ratio (ref 0.0–5.0)
Cholesterol, Total: 181 mg/dL (ref 100–199)
HDL: 82 mg/dL (ref >39)
LDL Chol Calc (NIH): 86 mg/dL (ref 0–99)
Triglycerides: 72 mg/dL (ref 0–149)
VLDL Cholesterol Cal: 13 mg/dL (ref 5–40)

## 2020-10-27 ENCOUNTER — Other Ambulatory Visit: Payer: Self-pay | Admitting: Cardiovascular Disease

## 2021-06-14 ENCOUNTER — Other Ambulatory Visit: Payer: Self-pay | Admitting: Cardiovascular Disease

## 2021-07-09 ENCOUNTER — Ambulatory Visit (INDEPENDENT_AMBULATORY_CARE_PROVIDER_SITE_OTHER): Payer: Medicare Other | Admitting: Cardiovascular Disease

## 2021-07-09 ENCOUNTER — Other Ambulatory Visit: Payer: Self-pay

## 2021-07-09 ENCOUNTER — Encounter: Payer: Self-pay | Admitting: Cardiovascular Disease

## 2021-07-09 VITALS — BP 100/72 | HR 76 | Ht 69.0 in | Wt 167.8 lb

## 2021-07-09 DIAGNOSIS — E782 Mixed hyperlipidemia: Secondary | ICD-10-CM | POA: Diagnosis not present

## 2021-07-09 DIAGNOSIS — I2510 Atherosclerotic heart disease of native coronary artery without angina pectoris: Secondary | ICD-10-CM | POA: Diagnosis not present

## 2021-07-09 MED ORDER — ROSUVASTATIN CALCIUM 10 MG PO TABS: 10.0000 mg | ORAL_TABLET | Freq: Every day | ORAL | 3 refills | Status: DC

## 2021-07-09 NOTE — Patient Instructions (Addendum)
Medication Instructions:  Your physician has recommended you make the following change in your medication:  INCREASE Rosuvastatin to 10 mg daily  *If you need a refill on your cardiac medications before your next appointment, please call your pharmacy*   Lab Work: Your physician recommends that you return for lab work in 2 months on November 29. You may come in anytime between 7:30 am and 4:45 pm You will need to FAST for this appointment - nothing to eat or drink after midnight the night before except water.  If you have labs (blood work) drawn today and your tests are completely normal, you will receive your results only by: [place] (if you have MyChart) OR A paper copy in the mail If you have any lab test that is abnormal or we need to change your treatment, we will call you to review the results.   Testing/Procedures: None Ordered   Follow-Up: At [facility], you and your health needs are our priority.  As part of our continuing mission to provide you with exceptional heart care, we have created designated Provider Care Teams.  These Care Teams include your primary Cardiologist (physician) and Advanced Practice Providers (APPs -  Physician Assistants and Nurse Practitioners) who all work together to provide you with the care you need, when you need it.  Your next appointment:   1 year(s)  The format for your next appointment:   In Person  Provider:   You may see Mertie Moores, MD or one of the following Advanced Practice Providers on your designated Care Team:   Richardson Dopp, PA-C [place], [place]
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
## 2021-09-14 ENCOUNTER — Other Ambulatory Visit: Payer: Medicare Other

## 2021-09-27 ENCOUNTER — Other Ambulatory Visit: Payer: Medicare Other | Admitting: *Deleted

## 2021-09-27 ENCOUNTER — Other Ambulatory Visit: Payer: Self-pay

## 2021-09-27 DIAGNOSIS — E782 Mixed hyperlipidemia: Secondary | ICD-10-CM

## 2021-09-27 DIAGNOSIS — I2510 Atherosclerotic heart disease of native coronary artery without angina pectoris: Secondary | ICD-10-CM

## 2021-09-27 LAB — LIPID PANEL
Chol/HDL Ratio: 2.5 ratio (ref 0.0–5.0)
Cholesterol, Total: 175 mg/dL (ref 100–199)
HDL: 70 mg/dL (ref >39)
LDL Chol Calc (NIH): 88 mg/dL (ref 0–99)
Triglycerides: 94 mg/dL (ref 0–149)
VLDL Cholesterol Cal: 17 mg/dL (ref 5–40)

## 2021-09-27 LAB — BASIC METABOLIC PANEL
BUN/Creatinine Ratio: 24 (ref 10–24)
BUN: 22 mg/dL (ref 8–27)
CO2: 25 mmol/L (ref 20–29)
Calcium: 9.3 mg/dL (ref 8.6–10.2)
Chloride: 102 mmol/L (ref 96–106)
Creatinine, Ser: 0.92 mg/dL (ref 0.76–1.27)
Glucose: 99 mg/dL (ref 70–99)
Potassium: 4.5 mmol/L (ref 3.5–5.2)
Sodium: 139 mmol/L (ref 134–144)
eGFR: 89 mL/min/{1.73_m2} (ref >59)

## 2021-09-27 LAB — ALT: ALT: 36 IU/L (ref 0–44)

## 2021-09-29 ENCOUNTER — Telehealth: Payer: Self-pay

## 2021-09-29 DIAGNOSIS — I2510 Atherosclerotic heart disease of native coronary artery without angina pectoris: Secondary | ICD-10-CM

## 2021-09-29 DIAGNOSIS — E782 Mixed hyperlipidemia: Secondary | ICD-10-CM

## 2021-09-29 MED ORDER — EZETIMIBE 10 MG PO TABS: 10.0000 mg | ORAL_TABLET | Freq: Every day | ORAL | 3 refills | Status: DC

## 2021-09-29 NOTE — Telephone Encounter (Signed)
The patient has been notified of the result and verbalized understanding.  All questions (if any) were answered. Michaelyn Barter, RN 09/29/2021 3:52 PM   Patient coming in on 12/28/21 for lab work.

## 2021-09-29 NOTE — Telephone Encounter (Signed)
-----   Message from Thayer Headings, MD sent at 09/29/2021  1:51 PM EST ----- ALT and BMP are stable  LDL is still not at goal I would like to see his LDL between 50-70 Add zetia 10 mg a day  Check lipids, ALT  in 3 months

## 2021-12-28 ENCOUNTER — Other Ambulatory Visit: Payer: Medicare Other

## 2021-12-28 ENCOUNTER — Other Ambulatory Visit: Payer: Self-pay

## 2021-12-28 DIAGNOSIS — E782 Mixed hyperlipidemia: Secondary | ICD-10-CM

## 2021-12-28 DIAGNOSIS — I2510 Atherosclerotic heart disease of native coronary artery without angina pectoris: Secondary | ICD-10-CM

## 2021-12-28 LAB — LIPID PANEL
Chol/HDL Ratio: 2.1 ratio (ref 0.0–5.0)
Cholesterol, Total: 152 mg/dL (ref 100–199)
HDL: 74 mg/dL (ref >39)
LDL Chol Calc (NIH): 66 mg/dL (ref 0–99)
Triglycerides: 55 mg/dL (ref 0–149)
VLDL Cholesterol Cal: 12 mg/dL (ref 5–40)

## 2021-12-28 LAB — ALT: ALT: 24 IU/L (ref 0–44)

## 2022-02-23 ENCOUNTER — Encounter: Payer: Self-pay | Admitting: Gastroenterology

## 2022-03-23 ENCOUNTER — Ambulatory Visit (INDEPENDENT_AMBULATORY_CARE_PROVIDER_SITE_OTHER): Payer: Medicare Other | Admitting: Gastroenterology

## 2022-03-23 ENCOUNTER — Telehealth: Payer: Self-pay

## 2022-03-23 ENCOUNTER — Encounter: Payer: Self-pay | Admitting: Gastroenterology

## 2022-03-23 VITALS — BP 120/70 | HR 67 | Ht 69.0 in | Wt 168.0 lb

## 2022-03-23 DIAGNOSIS — Z7902 Long term (current) use of antithrombotics/antiplatelets: Secondary | ICD-10-CM | POA: Diagnosis not present

## 2022-03-23 DIAGNOSIS — Z86010 Personal history of colonic polyps: Secondary | ICD-10-CM | POA: Diagnosis not present

## 2022-03-23 DIAGNOSIS — I2510 Atherosclerotic heart disease of native coronary artery without angina pectoris: Secondary | ICD-10-CM | POA: Diagnosis not present

## 2022-03-23 MED ORDER — PLENVU 140 G PO SOLR: 1.0000 | Freq: Once | ORAL | 0 refills | Status: AC

## 2022-03-23 NOTE — Telephone Encounter (Signed)
[facility] Pre-operative Risk Assessment     Request for surgical clearance:     Endoscopy Procedure  What type of surgery is being performed?     EGD/Colon  When is this surgery scheduled?     05/11/22  What type of clearance is required ?   Pharmacy  Are there any medications that need to be held prior to surgery and how long? Plavix x 5 days  Practice name and name of physician performing surgery?      [facility]  What is your office phone and fax number?      Phone- [phone]  Fax859 [phone]  Anesthesia type (None, local, MAC, general) ?       MAC

## 2022-03-23 NOTE — Patient Instructions (Signed)
You can take over the counter Pepcid twice daily as needed for increase reflux symptoms.  You have been scheduled for an endoscopy and colonoscopy. Please follow the written instructions given to you at your visit today. Please pick up your prep supplies at the pharmacy within the next 1-3 days. If you use inhalers (even only as needed), please bring them with you on the day of your procedure.  Patient advised to avoid spicy, acidic, citrus, chocolate, mints, fruit and fruit juices.  Limit the intake of caffeine, alcohol and Soda.  Don't exercise too soon after eating.  Don't lie down within 3-4 hours of eating.  Elevate the head of your bed.   You will be contacted by our office prior to your procedure for directions on holding your Plavix.  If you do not hear from our office 1 week prior to your scheduled procedure, please call [phone] to discuss.   The [facility] providers would like to encourage you to use [facility] to communicate with providers for non-urgent requests or questions.  Due to long hold times on the telephone, sending your provider a message by [facility] may be a faster and more efficient way to get a response.  Please allow 48 business hours for a response.  Please remember that this is for non-urgent requests.   Due to recent changes in healthcare laws, you may see the results of your imaging and laboratory studies on MyChart before your provider has had a chance to review them.  We understand that in some cases there may be results that are confusing or concerning to you. Not all laboratory results come back in the same time frame and the provider may be waiting for multiple results in order to interpret others.  Please give [place] 48 hours in order for your provider to thoroughly review all the results before contacting the office for clarification of your results.   Thank you for choosing me and [facility].  Pricilla Riffle. Dagoberto Ligas., MD., [facility]

## 2022-03-23 NOTE — Progress Notes (Signed)
Assessment    Personal history of one adenomatous colon polyp in 2009 GERD. R/O esophagitis, Barrett's CAD post stent   Recommendations   Schedule colonoscopy for polyp surveillance. The risks (including bleeding, perforation, infection, missed lesions, medication reactions and possible hospitalization or surgery if complications occur), benefits, and alternatives to colonoscopy with possible biopsy and possible polypectomy were discussed with the patient and they consent to proceed.   Follow antireflux measures.  Pepcid AC twice daily as needed, Tums as needed, Maalox as needed.  Schedule EGD. The risks (including bleeding, perforation, infection, missed lesions, medication reactions and possible hospitalization or surgery if complications occur), benefits, and alternatives to endoscopy with possible biopsy and possible dilation were discussed with the patient and they consent to proceed.   Hold Plavix 5 days before procedure - will instruct when and how to resume after procedure. Low but real risk of cardiovascular event such as heart attack, stroke, embolism, thrombosis or ischemia/infarct of other organs off Plavix explained and need to seek urgent help if this occurs. The patient consents to proceed. Will communicate by phone or EMR with patient's prescribing provider to confirm that holding Plavix is reasonable in this case.   Continue aspirin 81 mg daily through the procedures.    HPI   Chief complaint: Personal history of colon polyps  Patient profile:  Jacob Obrien is a 71 y.o. male referred by Kathryne Eriksson, MD for personal history of adenomatous colon polyps and GERD.  He underwent colonoscopy by Dr. Collene Mares in 2009 and a 5 mm tubular adenoma was removed from the cecum.  The patient supplies a path report however the colonoscopy report is not available today. He states he had a Cologuard performed around 2015 that was negative.  He notes worsening problems with postprandial and  nocturnal reflux occurring about every other day over the past several months.  He takes Maalox and Tums several times per week.  No other gastrointestinal complaints. Denies weight loss, abdominal pain, constipation, diarrhea, change in stool caliber, melena, hematochezia, nausea, vomiting, dysphagia, chest pain.   Previous Labs / Imaging::    Latest Ref Rng & Units 10/25/2017    8:24 AM 07/25/2008    6:49 AM 07/24/2008    7:45 AM  CBC  WBC 4.0 - 10.5 K/uL 5.4   8.0     Hemoglobin 13.0 - 17.0 g/dL 15.3   13.0   15.6    Hematocrit 39.0 - 52.0 % 45.5   38.0   46.0    Platelets 150 - 400 K/uL 198   245       No results found for: LIPASE    Latest Ref Rng & Units 12/28/2021    7:24 AM 09/27/2021    7:12 AM 06/29/2020    7:28 AM  CMP  Glucose 70 - 99 mg/dL  99     BUN 8 - 27 mg/dL  22     Creatinine 0.76 - 1.27 mg/dL  0.92     Sodium 134 - 144 mmol/L  139     Potassium 3.5 - 5.2 mmol/L  4.5     Chloride 96 - 106 mmol/L  102     CO2 20 - 29 mmol/L  25     Calcium 8.6 - 10.2 mg/dL  9.3     Total Protein 6.0 - 8.5 g/dL   6.4    Total Bilirubin 0.0 - 1.2 mg/dL   0.8    Alkaline Phos 44 - 121 IU/L  6    AST 0 - 40 IU/L   19    ALT 0 - 44 IU/L 24   36   22       Previous GI evaluation    Endoscopies:  See HPI  Imaging:     Past Medical History:  Diagnosis Date   Allergy    Anxiety    Arthritis    CHF (congestive heart failure) (HCC)    Coronary artery disease    status post PTCA and stenting of his LAD in October of 2009. He had placement of a 3.0x49m Promus stent.    Depression    Hyperlipidemia    Myocardial infarction ([facility]    2009   Right groin hernia    Past Surgical History:  Procedure Laterality Date   CORONARY ANGIOPLASTY WITH STENT PLACEMENT  07-2008   stenting of his left anterior descending artery. EF 50-55%. There is slighthypokinesis of the apex. The mid LAD has a 95%-99% stenosis with TIMI grade 2 flow down the LAD and diagonal vessel.   HERNIA  REPAIR     Left groin at age 71  INGUINAL HERNIA REPAIR Right 10/30/2017   Procedure: HERNIA REPAIR INGUINAL ADULT WITH MESH;  Surgeon: WRolm Bookbinder MD;  [facility]  Service: General;  Laterality: Right;   INSERTION OF MESH Right 10/30/2017   Procedure: INSERTION OF MESH;  Surgeon: WRolm Bookbinder MD;  [facility]  Service: General;  Laterality: Right;   VASECTOMY     Family History  Problem Relation Age of Onset   Angina Mother    CAD Father    Social History   Tobacco Use   Smoking status: Never   Smokeless tobacco: Never  Vaping Use   Vaping Use: Never used  Substance Use Topics   Alcohol use: Yes    Comment: Rarely   Drug use: No   Current Outpatient Medications  Medication Sig Dispense Refill   aspirin 81 MG tablet Take 81 mg by mouth daily.      Cholecalciferol (VITAMIN D PO) Take 1 capsule by mouth daily.     clonazePAM (KLONOPIN) 0.5 MG disintegrating tablet Take 0.5 tablets by mouth as needed for anxiety.     clopidogrel (PLAVIX) 75 MG tablet TAKE 1 TABLET EVERY DAY 90 tablet 3   Coenzyme Q10-Vitamin E 100-150 MG-UNIT CAPS Take 1 tablet by mouth daily.     Cyanocobalamin (B-12 PO) Take 1 tablet by mouth daily.     finasteride (PROSCAR) 5 MG tablet Take 5 mg by mouth daily.     Fluoxetine HCl, PMDD, 20 MG TABS Take 1 tablet by mouth daily.     Glucosamine-Chondroitin (GLUCOSAMINE CHONDR COMPLEX PO) Take 1 tablet by mouth 2 (two) times daily.      Multiple Vitamins-Minerals (MULTIVITAMIN PO) Take 1 tablet by mouth daily.      rosuvastatin (CRESTOR) 10 MG tablet Take 1 tablet (10 mg total) by mouth daily. 90 tablet 3   terazosin (HYTRIN) 5 MG capsule Take 5 mg by mouth every evening.      acetaminophen (TYLENOL) 500 MG tablet Take 1,000 mg by mouth every 6 (six) hours as needed (sore throat / cold symptoms).     ezetimibe (ZETIA) 10 MG tablet Take 1 tablet (10 mg total) by mouth daily. 90 tablet 3   No current facility-administered medications for  this visit.   Allergies  Allergen Reactions   Atorvastatin Other (See Comments)     Muscle pain  Review of Systems: All other systems reviewed and negative except where noted in HPI.    Physical Exam    Wt Readings from Last 3 Encounters:  03/23/22 168 lb (76.2 kg)  07/09/21 167 lb 12.8 oz (76.1 kg)  06/24/20 170 lb (77.1 kg)    BP 120/70   Pulse 67   Ht '5\' 9"'$  (1.753 m)   Wt 168 lb (76.2 kg)   BMI 24.81 kg/m  Constitutional:  Generally well appearing male in no acute distress. Psychiatric: Pleasant. Normal mood and affect. Behavior is normal. EENT: Pupils normal.  Conjunctivae are normal. No scleral icterus. Neck supple.  Cardiovascular: Normal rate, regular rhythm. No edema Pulmonary/chest: Effort normal and breath sounds normal. No wheezing, rales or rhonchi. Abdominal: Soft, nondistended, nontender. Bowel sounds active throughout. There are no masses palpable. No hepatomegaly. Rectal: Deferred to colonoscopy Neurological: Alert and oriented to person place and time. Skin: Skin is warm and dry. No rashes noted.  Lucio Hancel, MD   cc:  Referring Provider Christain Sacramento, MD

## 2022-03-24 NOTE — Telephone Encounter (Signed)
   Patient Name: Jacob Obrien  DOB: Aug 06, 1951 MRN: [id number]  Primary Cardiologist: Mertie Moores, MD  Chart reviewed as part of pre-operative protocol coverage.   71 year old male with a history of CAD s/p DES-LAD in 2009 and hyperlipidemia.   He was last seen in the office on 07/09/2021 and was doing well from a cardiac standpoint.  He was biking 10 miles a day and denied symptoms concerning for angina.  Received surgical clearance request for upcoming EGD/colonoscopy scheduled for 05/11/2022 with request to hold Plavix 5 days prior to procedure.  Dr. Acie Fredrickson, please advise on holding Plavix prior to procedure.  Please route your response to P CV DIV PREOP.  Thank you.   Lenna Sciara, NP 03/24/2022, 9:48 AM

## 2022-03-25 NOTE — Telephone Encounter (Signed)
    Name: Jacob Obrien  DOB: Jul 29, 1951  MRN: [id number]  Primary Cardiologist: Mertie Moores, MD   Preoperative team, please contact this patient and set up a phone call appointment for further preoperative risk assessment. Please obtain consent and complete medication review. Thank you for your help.  I confirm that guidance regarding antiplatelet and oral anticoagulation therapy has been completed and, if necessary, noted below.  Per Dr. Acie Fredrickson, primary cardiologist, may hold his Plavix for 5 days prior to the procedure. Please resume Plavix as soon as possible postprocedure, at the discretion of the surgeon.  Lenna Sciara, NP 03/25/2022, 4:19 PM [place] [address] [place], [place] [address]

## 2022-03-25 NOTE — Telephone Encounter (Signed)
Left message for the pt to call back for tele pre op appt.  ?

## 2022-03-28 NOTE — Telephone Encounter (Signed)
Patient is returning call.  °

## 2022-03-29 ENCOUNTER — Telehealth: Payer: Self-pay | Admitting: Cardiovascular Disease

## 2022-03-29 ENCOUNTER — Telehealth: Payer: Self-pay | Admitting: *Deleted

## 2022-03-29 NOTE — Telephone Encounter (Signed)
Pt returning nurses all regarding Pre - Op appt. Please advise

## 2022-03-29 NOTE — Telephone Encounter (Signed)
  Patient Consent for Virtual Visit        Jacob Obrien has provided verbal consent on 03/29/2022 for a virtual visit (video or telephone).   CONSENT FOR VIRTUAL VISIT FOR:  Jacob Obrien  By participating in this virtual visit I agree to the following:  I hereby voluntarily request, consent and authorize [facility] and its employed or contracted physicians, physician assistants, nurse practitioners or other licensed health care professionals (the Practitioner), to provide me with telemedicine health care services (the "Services") as deemed necessary by the treating Practitioner. I acknowledge and consent to receive the Services by the Practitioner via telemedicine. I understand that the telemedicine visit will involve communicating with the Practitioner through live audiovisual communication technology and the disclosure of certain medical information by electronic transmission. I acknowledge that I have been given the opportunity to request an in-person assessment or other available alternative prior to the telemedicine visit and am voluntarily participating in the telemedicine visit.  I understand that I have the right to withhold or withdraw my consent to the use of telemedicine in the course of my care at any time, without affecting my right to future care or treatment, and that the Practitioner or I may terminate the telemedicine visit at any time. I understand that I have the right to inspect all information obtained and/or recorded in the course of the telemedicine visit and may receive copies of available information for a reasonable fee.  I understand that some of the potential risks of receiving the Services via telemedicine include:  Delay or interruption in medical evaluation due to technological equipment failure or disruption; Information transmitted may not be sufficient (e.g. poor resolution of images) to allow for appropriate medical decision making by the  Practitioner; and/or  In rare instances, security protocols could fail, causing a breach of personal health information.  Furthermore, I acknowledge that it is my responsibility to provide information about my medical history, conditions and care that is complete and accurate to the best of my ability. I acknowledge that Practitioner's advice, recommendations, and/or decision may be based on factors not within their control, such as incomplete or inaccurate data provided by me or distortions of diagnostic images or specimens that may result from electronic transmissions. I understand that the practice of medicine is not an exact science and that Practitioner makes no warranties or guarantees regarding treatment outcomes. I acknowledge that a copy of this consent can be made available to me via my patient portal ([facility]), or I can request a printed copy by calling the office of [facility].    I understand that my insurance will be billed for this visit.   I have read or had this consent read to me. I understand the contents of this consent, which adequately explains the benefits and risks of the Services being provided via telemedicine.  I have been provided ample opportunity to ask questions regarding this consent and the Services and have had my questions answered to my satisfaction. I give my informed consent for the services to be provided through the use of telemedicine in my medical care

## 2022-03-29 NOTE — Telephone Encounter (Signed)
Spoke with patient and scheduled him for a pre-op telehealth appointment for tomorrow 03/30/22 at 9:20 AM.

## 2022-03-29 NOTE — Telephone Encounter (Signed)
Spoke with patient and schedule him for a telehealth appointment for tomorrow at 9:20 AM for pre-op clearance.

## 2022-03-30 ENCOUNTER — Encounter: Payer: Self-pay | Admitting: Nurse Practitioner

## 2022-03-30 ENCOUNTER — Ambulatory Visit (INDEPENDENT_AMBULATORY_CARE_PROVIDER_SITE_OTHER): Payer: Medicare Other | Admitting: Nurse Practitioner

## 2022-03-30 DIAGNOSIS — Z01810 Encounter for preprocedural cardiovascular examination: Secondary | ICD-10-CM

## 2022-03-30 NOTE — Progress Notes (Signed)
Virtual Visit via Telephone Note   Because of Jacob Obrien's co-morbid illnesses, he is at least at moderate risk for complications without adequate follow up.  This format is felt to be most appropriate for this patient at this time.  The patient did not have access to video technology/had technical difficulties with video requiring transitioning to audio format only (telephone).  All issues noted in this document were discussed and addressed.  No physical exam could be performed with this format.  Please refer to the patient's chart for his consent to telehealth for [facility].  Evaluation Performed:  Preoperative cardiovascular risk assessment _____________   Date:  03/30/2022   Patient ID:  Jacob Obrien, DOB 1951-04-10, MRN [id number] Patient Location:  Home Provider location:   Office  Primary Care Provider:  Christain Sacramento, MD Primary Cardiologist:  Mertie Moores, MD  Chief Complaint / Patient Profile   71 y.o. y/o male with a h/o CAD s/p PTCA and stenting of his LAD with DES October 2009, hyperlipidemia who is pending EGD/colonoscopy and presents today for telephonic preoperative cardiovascular risk assessment.  Past Medical History    Past Medical History:  Diagnosis Date   Allergy    Anxiety    Arthritis    CHF (congestive heart failure) ([place])    Coronary artery disease    status post PTCA and stenting of his LAD in October of 2009. He had placement of a 3.0x48m Promus stent.    Depression    Hyperlipidemia    Myocardial infarction ([facility]    2009   Right groin hernia    Past Surgical History:  Procedure Laterality Date   CORONARY ANGIOPLASTY WITH STENT PLACEMENT  07-2008   stenting of his left anterior descending artery. EF 50-55%. There is slighthypokinesis of the apex. The mid LAD has a 95%-99% stenosis with TIMI grade 2 flow down the LAD and diagonal vessel.   HERNIA REPAIR     Left groin at age 71  INGUINAL HERNIA REPAIR Right 10/30/2017    Procedure: HERNIA REPAIR INGUINAL ADULT WITH MESH;  Surgeon: WRolm Bookbinder MD;  [place] [facility]  Service: General;  Laterality: Right;   INSERTION OF MESH Right 10/30/2017   Procedure: INSERTION OF MESH;  Surgeon: WRolm Bookbinder MD;  [place]  Service: General;  Laterality: Right;   VASECTOMY      Allergies  Allergies  Allergen Reactions   Atorvastatin Other (See Comments)     Muscle pain    History of Present Illness    Jacob HARJUis a 71y.o. male who presents via audio/video conferencing for a telehealth visit today.  Pt was last seen in cardiology clinic on 07/09/2021 by Dr. NAcie Fredrickson  At that time EKAYDAN WONGwas doing well.  The patient is now pending procedure as outlined above. Since his last visit, he denies chest pain, shortness of breath, lower extremity edema, fatigue, palpitations, melena, hematuria, hemoptysis, diaphoresis, weakness, presyncope, syncope, orthopnea, and PND.  Home Medications    Prior to Admission medications   Medication Sig Start Date End Date Taking? Authorizing Provider  acetaminophen (TYLENOL) 500 MG tablet Take 1,000 mg by mouth every 6 (six) hours as needed (sore throat / cold symptoms).    [provider]  aspirin 81 MG tablet Take 81 mg by mouth daily.     [provider]  Cholecalciferol (VITAMIN D PO) Take 1 capsule by mouth daily.    [provider]  clonazePAM (KLONOPIN) 0.5 MG disintegrating tablet Take 0.5 tablets by mouth as needed for anxiety. 03/24/21   [provider]  clopidogrel (PLAVIX) 75 MG tablet TAKE 1 TABLET EVERY DAY 06/14/21   Nahser, Wonda Cheng, MD  Coenzyme Q10-Vitamin E 100-150 MG-UNIT CAPS Take 1 tablet by mouth daily.    [provider]  Cyanocobalamin (B-12 PO) Take 1 tablet by mouth daily.    [provider]  ezetimibe (ZETIA) 10 MG tablet Take 1 tablet (10 mg total) by mouth daily. 09/29/21 12/28/21  Nahser, Wonda Cheng, MD  finasteride (PROSCAR)  5 MG tablet Take 5 mg by mouth daily. 01/25/16   [provider]  Fluoxetine HCl, PMDD, 20 MG TABS Take 1 tablet by mouth daily. 03/24/21   [provider]  Glucosamine-Chondroitin (GLUCOSAMINE CHONDR COMPLEX PO) Take 1 tablet by mouth 2 (two) times daily.     [provider]  Multiple Vitamins-Minerals (MULTIVITAMIN PO) Take 1 tablet by mouth daily.     [provider]  rosuvastatin (CRESTOR) 10 MG tablet Take 1 tablet (10 mg total) by mouth daily. 07/09/21   Nahser, Wonda Cheng, MD  terazosin (HYTRIN) 5 MG capsule Take 5 mg by mouth every evening.  01/25/16   [provider]    Physical Exam    Vital Signs:  [name] does not have vital signs available for review today.  Given telephonic nature of communication, physical exam is limited. AAOx3. NAD. Normal affect.  Speech and respirations are unlabored.  Accessory Clinical Findings    None  Assessment & Plan    1.  Preoperative Cardiovascular Risk Assessment: He is doing well from a cardiac perspective and may proceed without further testing.According to the Revised Cardiac Risk Index (RCRI), his Perioperative Risk of Major Cardiac Event is (%): 0.4. His Functional Capacity in METs is: 7.59 according to the Duke Activity Status Index (DASI).  Per Dr. Acie Fredrickson, primary cardiologist, may hold his Plavix for 5 days prior to the procedure. Please resume Plavix as soon as possible postprocedure, at the discretion of the surgeon.   A copy of this note will be routed to requesting surgeon.  Time:   Today, I have spent 10 minutes with the patient with telehealth technology discussing medical history, symptoms, and management plan.     [name]

## 2022-03-30 NOTE — Telephone Encounter (Signed)
Per Dr. Acie Fredrickson, primary cardiologist, may hold his Plavix for 5 days prior to the procedure. Please resume Plavix as soon as possible postprocedure, at the discretion of the surgeon. patient has been notified and aware.

## 2022-05-11 ENCOUNTER — Ambulatory Visit (AMBULATORY_SURGERY_CENTER): Payer: Medicare Other | Admitting: Gastroenterology

## 2022-05-11 ENCOUNTER — Encounter: Payer: Self-pay | Admitting: Gastroenterology

## 2022-05-11 VITALS — BP 122/63 | HR 69 | Temp 98.4°F | Resp 16 | Ht 69.0 in | Wt 168.0 lb

## 2022-05-11 DIAGNOSIS — K219 Gastro-esophageal reflux disease without esophagitis: Secondary | ICD-10-CM

## 2022-05-11 DIAGNOSIS — D123 Benign neoplasm of transverse colon: Secondary | ICD-10-CM

## 2022-05-11 DIAGNOSIS — Z86010 Personal history of colonic polyps: Secondary | ICD-10-CM | POA: Diagnosis not present

## 2022-05-11 DIAGNOSIS — D125 Benign neoplasm of sigmoid colon: Secondary | ICD-10-CM

## 2022-05-11 DIAGNOSIS — Z09 Encounter for follow-up examination after completed treatment for conditions other than malignant neoplasm: Secondary | ICD-10-CM

## 2022-05-11 DIAGNOSIS — K222 Esophageal obstruction: Secondary | ICD-10-CM

## 2022-05-11 DIAGNOSIS — K449 Diaphragmatic hernia without obstruction or gangrene: Secondary | ICD-10-CM

## 2022-05-11 DIAGNOSIS — K2100 Gastro-esophageal reflux disease with esophagitis, without bleeding: Secondary | ICD-10-CM

## 2022-05-11 DIAGNOSIS — D122 Benign neoplasm of ascending colon: Secondary | ICD-10-CM | POA: Diagnosis not present

## 2022-05-11 MED ORDER — SODIUM CHLORIDE 0.9 % IV SOLN: 500.0000 mL | Freq: Once | INTRAVENOUS | Status: AC

## 2022-05-11 MED ORDER — PANTOPRAZOLE SODIUM 40 MG PO TBEC: 40.0000 mg | DELAYED_RELEASE_TABLET | Freq: Every day | ORAL | 4 refills | Status: DC

## 2022-05-11 NOTE — Progress Notes (Signed)
Pt's states no medical or surgical changes since previsit or office visit. 

## 2022-05-11 NOTE — Progress Notes (Signed)
History & Physical  Primary Care Physician:  Christain Sacramento, MD Primary Gastroenterologist: Lucio Jentry, MD  CHIEF COMPLAINT:  GERD, Personal history of colon polyps   HPI: Jacob Obrien is a 71 y.o. male with a personal history of adenomatous colon polyps in 2009 and chronic GERD for colonoscopy and EGD.   Past Medical History:  Diagnosis Date   Allergy    Anxiety    Arthritis    CHF (congestive heart failure) ([place])    Coronary artery disease    status post PTCA and stenting of his LAD in October of 2009. He had placement of a 3.0x18m Promus stent.    Depression    Hyperlipidemia    Myocardial infarction ([facility]    2009   Right groin hernia     Past Surgical History:  Procedure Laterality Date   CORONARY ANGIOPLASTY WITH STENT PLACEMENT  07-2008   stenting of his left anterior descending artery. EF 50-55%. There is slighthypokinesis of the apex. The mid LAD has a 95%-99% stenosis with TIMI grade 2 flow down the LAD and diagonal vessel.   HERNIA REPAIR     Left groin at age 71  INGUINAL HERNIA REPAIR Right 10/30/2017   Procedure: HERNIA REPAIR INGUINAL ADULT WITH MESH;  Surgeon: WRolm Bookbinder MD;  [place]  Service: General;  Laterality: Right;   INSERTION OF MESH Right 10/30/2017   Procedure: INSERTION OF MESH;  Surgeon: WRolm Bookbinder MD;  [place] [facility]  Service: General;  Laterality: Right;   VASECTOMY      Prior to Admission medications   Medication Sig Start Date End Date Taking? Authorizing Provider  aspirin 81 MG tablet Take 81 mg by mouth daily.    Yes [provider]  Cholecalciferol (VITAMIN D PO) Take 1 capsule by mouth daily.   Yes [provider]  clonazePAM (KLONOPIN) 0.5 MG disintegrating tablet Take 0.5 tablets by mouth as needed for anxiety. 03/24/21  Yes [provider]  Coenzyme Q10-Vitamin E 100-150 MG-UNIT CAPS Take 1 tablet by mouth daily.   Yes [provider]  Cyanocobalamin (B-12 PO)  Take 1 tablet by mouth daily.   Yes [provider]  finasteride (PROSCAR) 5 MG tablet Take 5 mg by mouth daily. 01/25/16  Yes [provider]  Fluoxetine HCl, PMDD, 20 MG TABS Take 1 tablet by mouth daily. 03/24/21  Yes [provider]  Glucosamine-Chondroitin (GLUCOSAMINE CHONDR COMPLEX PO) Take 1 tablet by mouth 2 (two) times daily.    Yes [provider]  Multiple Vitamins-Minerals (MULTIVITAMIN PO) Take 1 tablet by mouth daily.    Yes [provider]  rosuvastatin (CRESTOR) 10 MG tablet Take 1 tablet (10 mg total) by mouth daily. 07/09/21  Yes Nahser, PWonda Cheng MD  terazosin (HYTRIN) 5 MG capsule Take 5 mg by mouth every evening.  01/25/16  Yes [provider]  clopidogrel (PLAVIX) 75 MG tablet TAKE 1 TABLET EVERY DAY 06/14/21   Nahser, PWonda Cheng MD  ezetimibe (ZETIA) 10 MG tablet Take 1 tablet (10 mg total) by mouth daily. 09/29/21 12/28/21  Nahser, PWonda Cheng MD    Current Outpatient Medications  Medication Sig Dispense Refill   aspirin 81 MG tablet Take 81 mg by mouth daily.      Cholecalciferol (VITAMIN D PO) Take 1 capsule by mouth daily.     clonazePAM (KLONOPIN) 0.5 MG disintegrating tablet Take 0.5 tablets by mouth as needed for anxiety.     Coenzyme Q10-Vitamin  E 100-150 MG-UNIT CAPS Take 1 tablet by mouth daily.     Cyanocobalamin (B-12 PO) Take 1 tablet by mouth daily.     finasteride (PROSCAR) 5 MG tablet Take 5 mg by mouth daily.     Fluoxetine HCl, PMDD, 20 MG TABS Take 1 tablet by mouth daily.     Glucosamine-Chondroitin (GLUCOSAMINE CHONDR COMPLEX PO) Take 1 tablet by mouth 2 (two) times daily.      Multiple Vitamins-Minerals (MULTIVITAMIN PO) Take 1 tablet by mouth daily.      rosuvastatin (CRESTOR) 10 MG tablet Take 1 tablet (10 mg total) by mouth daily. 90 tablet 3   terazosin (HYTRIN) 5 MG capsule Take 5 mg by mouth every evening.      clopidogrel (PLAVIX) 75 MG tablet TAKE 1 TABLET EVERY DAY 90 tablet 3   ezetimibe  (ZETIA) 10 MG tablet Take 1 tablet (10 mg total) by mouth daily. 90 tablet 3   Current Facility-Administered Medications  Medication Dose Route Frequency Provider Last Rate Last Admin   0.9 %  sodium chloride infusion  500 mL Intravenous Once Ladene Artist, MD        Allergies as of 05/11/2022 - Review Complete 05/11/2022  Allergen Reaction Noted   Atorvastatin Other (See Comments) 12/23/2021    Family History  Problem Relation Age of Onset   Angina Mother    CAD Father     Social History   Socioeconomic History   Marital status: Divorced    Spouse name: Not on file   Number of children: 3   Years of education: Not on file   Highest education level: Not on file  Occupational History   Not on file  Tobacco Use   Smoking status: Never   Smokeless tobacco: Never  Vaping Use   Vaping Use: Never used  Substance and Sexual Activity   Alcohol use: Yes    Comment: Rarely   Drug use: No   Sexual activity: Not on file  Other Topics Concern   Not on file  Social History Narrative   Not on file   Social Determinants of Health   Financial Resource Strain: Not on file  Food Insecurity: Not on file  Transportation Needs: Not on file  Physical Activity: Not on file  Stress: Not on file  Social Connections: Not on file  Intimate Partner Violence: Not on file    Review of Systems:  All systems reviewed were negative except where noted in HPI.   Physical Exam: General:  Alert, well-developed, in NAD Head:  Normocephalic and atraumatic. Eyes:  Sclera clear, no icterus.   Conjunctiva pink. Ears:  Normal auditory acuity. Mouth:  No deformity or lesions.  Neck:  Supple; no masses . Lungs:  Clear throughout to auscultation.   No wheezes, crackles, or rhonchi. No acute distress. Heart:  Regular rate and rhythm; no murmurs. Abdomen:  Soft, nondistended, nontender. No masses, hepatomegaly. No obvious masses.  Normal bowel .    Rectal:  Deferred   Msk:  Symmetrical  without gross deformities.. Pulses:  Normal pulses noted. Extremities:  Without edema. Neurologic:  Alert and  oriented x4;  grossly normal neurologically. Skin:  Intact without significant lesions or rashes. Cervical Nodes:  No significant cervical adenopathy. Psych:  Alert and cooperative. Normal mood and affect.   Impression / Plan:   Personal history of adenomatous colon polyps in 2009 and chronic GERD for colonoscopy and EGD.  Pricilla Riffle. Fuller Plan  05/11/2022, 3:22 PM See [facility], to contact our on call provider

## 2022-05-11 NOTE — Patient Instructions (Signed)
Start Pantoprazole Resume Plavix  in 2 days-Friday 05/13/2022  YOU HAD AN ENDOSCOPIC PROCEDURE TODAY: Refer to the procedure report and other information in the discharge instructions given to you for any specific questions about what was found during the examination. If this information does not answer your questions, please call [facility] office at [phone] to clarify.   YOU SHOULD EXPECT: Some feelings of bloating in the abdomen. Passage of more gas than usual. Walking can help get rid of the air that was put into your GI tract during the procedure and reduce the bloating. If you had a lower endoscopy (such as a colonoscopy or flexible sigmoidoscopy) you may notice spotting of blood in your stool or on the toilet paper. Some abdominal soreness may be present for a day or two, also.  DIET: Your first meal following the procedure should be a light meal and then it is ok to progress to your normal diet. A half-sandwich or bowl of soup is an example of a good first meal. Heavy or fried foods are harder to digest and may make you feel nauseous or bloated. Drink plenty of fluids but you should avoid alcoholic beverages for 24 hours. If you had a esophageal dilation, please see attached instructions for diet.    ACTIVITY: Your care partner should take you home directly after the procedure. You should plan to take it easy, moving slowly for the rest of the day. You can resume normal activity the day after the procedure however YOU SHOULD NOT DRIVE, use power tools, machinery or perform tasks that involve climbing or major physical exertion for 24 hours (because of the sedation medicines used during the test).   SYMPTOMS TO REPORT IMMEDIATELY: A gastroenterologist can be reached at any hour. Please call [phone]  for any of the following symptoms:  Following lower endoscopy (colonoscopy, flexible sigmoidoscopy) Excessive amounts of blood in the stool  Significant tenderness, worsening of abdominal  pains  Swelling of the abdomen that is new, acute  Fever of 100 or higher  Following upper endoscopy (EGD, EUS, ERCP, esophageal dilation) Vomiting of blood or coffee ground material  New, significant abdominal pain  New, significant chest pain or pain under the shoulder blades  Painful or persistently difficult swallowing  New shortness of breath  Black, tarry-looking or red, bloody stools  FOLLOW UP:  If any biopsies were taken you will be contacted by phone or by letter within the next 1-3 weeks. Call [phone]  if you have not heard about the biopsies in 3 weeks.  Please also call with any specific questions about appointments or follow up tests.

## 2022-05-11 NOTE — Op Note (Signed)
[facility] Patient Name: Jacob Obrien Procedure Date: 05/11/2022 3:30 PM MRN: [id number] Endoscopist: Ladene Artist , MD Age: 71 Referring MD:  Date of Birth: [birth year] Gender: Male Account #: [id number] Procedure:                Upper GI endoscopy Indications:              Gastroesophageal reflux disease Medicines:                Monitored Anesthesia Care Procedure:                Pre-Anesthesia Assessment:                           - Prior to the procedure, a History and Physical                            was performed, and patient medications and                            allergies were reviewed. The patient's tolerance of                            previous anesthesia was also reviewed. The risks                            and benefits of the procedure and the sedation                            options and risks were discussed with the patient.                            All questions were answered, and informed consent                            was obtained. Prior Anticoagulants: The patient has                            taken Plavix (clopidogrel), last dose was 5 days                            prior to procedure. ASA Grade Assessment: III - A                            patient with severe systemic disease. After                            reviewing the risks and benefits, the patient was                            deemed in satisfactory condition to undergo the                            procedure.  After obtaining informed consent, the endoscope was                            passed under direct vision. Throughout the                            procedure, the patient's blood pressure, pulse, and                            oxygen saturations were monitored continuously. The                            GIF [id number] #[id number] was introduced through the                            mouth, and advanced to the second part of duodenum.                             The upper GI endoscopy was accomplished without                            difficulty. The patient tolerated the procedure                            well. Scope In: Scope Out: Findings:                 LA Grade C (one or more mucosal breaks continuous                            between tops of 2 or more mucosal folds, less than                            75% circumference) esophagitis with no bleeding was                            found in the distal esophagus. Biopsies were taken                            with a cold forceps for histology.                           One benign-appearing, intrinsic mild stenosis was                            found 35 cm from the incisors. This stenosis                            measured 1.5 cm (inner diameter) x less than one cm                            (in length). The stenosis was traversed.  The exam of the esophagus was otherwise normal.                           A medium-sized hiatal hernia (5 cm) was present.                           The exam of the stomach was otherwise normal.                            Biopsies were taken with a cold forceps for                            histology.                           A few localized erosions without bleeding were                            found in the duodenal bulb.                           The exam of the duodenum was otherwise normal. Complications:            No immediate complications. Estimated Blood Loss:     Estimated blood loss was minimal. 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                            Biopsied.                           - Benign-appearing esophageal stenosis.                           - Medium-sized hiatal hernia.                           - Otherwise normal appearing stomach. Biopsied.                           - Duodenal erosions without bleeding. Recommendation:           - Patient  has a contact number available for                            emergencies. The signs and symptoms of potential                            delayed complications were discussed with the                            patient. Return to normal activities tomorrow.                            Written discharge instructions were provided to the  patient.                           - Resume previous diet.                           - Follow antireflux measures long term.                           - Continue present medications.                           - Pantoprazole 40 mg po qd, 1 year of refills.                           - Await pathology results. Ladene Artist, MD 05/11/2022 4:21:32 PM This report has been signed electronically.

## 2022-05-11 NOTE — Op Note (Signed)
[place] Patient Name: Jacob Obrien Procedure Date: 05/11/2022 3:31 PM MRN: [id number] Endoscopist: Ladene Artist , MD Age: 71 Referring MD:  Date of Birth: Nov 29, 1950 Gender: Male Account #: [id number] Procedure:                Colonoscopy Indications:              Surveillance: Personal history of adenomatous                            polyps on last colonoscopy > 5 years ago Medicines:                Monitored Anesthesia Care Procedure:                Pre-Anesthesia Assessment:                           - Prior to the procedure, a History and Physical                            was performed, and patient medications and                            allergies were reviewed. The patient's tolerance of                            previous anesthesia was also reviewed. The risks                            and benefits of the procedure and the sedation                            options and risks were discussed with the patient.                            All questions were answered, and informed consent                            was obtained. Prior Anticoagulants: The patient has                            taken Plavix (clopidogrel), last dose was 5 days                            prior to procedure. ASA Grade Assessment: III - A                            patient with severe systemic disease. After                            reviewing the risks and benefits, the patient was                            deemed in satisfactory condition to undergo the  procedure.                           After obtaining informed consent, the colonoscope                            was passed under direct vision. Throughout the                            procedure, the patient's blood pressure, pulse, and                            oxygen saturations were monitored continuously. The                            CF [id number] #[id number] was introduced through the anus                             and advanced to the the cecum, identified by                            appendiceal orifice and ileocecal valve. The                            ileocecal valve, appendiceal orifice, and rectum                            were photographed. The quality of the bowel                            preparation was good. The colonoscopy was performed                            without difficulty. The patient tolerated the                            procedure well. Scope In: 3:36:35 PM Scope Out: 4:01:21 PM Scope Withdrawal Time: 0 hours 21 minutes 48 seconds  Total Procedure Duration: 0 hours 24 minutes 46 seconds  Findings:                 The perianal and digital rectal examinations were                            normal.                           Four sessile polyps were found in the distal                            sigmoid colon (1), transverse colon (1) and                            ascending colon (2). The polyps were 5 to 8 mm in  size. These polyps were removed with a cold snare.                            Resection and retrieval were complete except for                            the 5 mm distal sigmoid colon polyp.                           A few small-mouthed diverticula were found in the                            right colon. There was no evidence of diverticular                            bleeding.                           Multiple medium-mouthed diverticula were found in                            the left colon. There was no evidence of                            diverticular bleeding.                           The exam was otherwise without abnormality on                            direct and retroflexion views. Complications:            No immediate complications. Estimated blood loss:                            None. Estimated Blood Loss:     Estimated blood loss: none. Impression:               - Four 5 to 8 mm polyps in the  distal sigmoid                            colon, in the transverse colon and in the ascending                            colon, removed with a cold snare. Resected and                            retrieved.                           - Mild diverticulosis in the right colon.                           - Mild diverticulosis in the left colon.                           -  The examination was otherwise normal on direct                            and retroflexion views. Recommendation:           - Repeat colonoscopy after studies are complete for                            surveillance based on pathology results.                           - Resume Plavix (clopidogrel) in 2 days at prior                            dose. Refer to managing physician for further                            adjustment of therapy.                           - Patient has a contact number available for                            emergencies. The signs and symptoms of potential                            delayed complications were discussed with the                            patient. Return to normal activities tomorrow.                            Written discharge instructions were provided to the                            patient.                           - High fiber diet.                           - Continue present medications.                           - Await pathology results. Ladene Artist, MD 05/11/2022 4:16:27 PM This report has been signed electronically.

## 2022-05-11 NOTE — Progress Notes (Signed)
Sedate, gd SR, tolerated procedure well, VSS, report to RN 

## 2022-05-12 ENCOUNTER — Telehealth: Payer: Self-pay | Admitting: *Deleted

## 2022-05-12 NOTE — Telephone Encounter (Signed)
  Follow up Call-     05/11/2022    2:26 PM  Call back number  Post procedure Call Back phone  # [phone]  Permission to leave phone message Yes     Patient questions:  Do you have a fever, pain , or abdominal swelling? No. Pain Score  0 *  Have you tolerated food without any problems? Yes.    Have you been able to return to your normal activities? Yes.    Do you have any questions about your discharge instructions: Diet   No. Medications  No. Follow up visit  No.  Do you have questions or concerns about your Care? No.  Actions: * If pain score is 4 or above: No action needed, pain <4.

## 2022-05-24 ENCOUNTER — Encounter: Payer: Self-pay | Admitting: Gastroenterology

## 2022-06-21 ENCOUNTER — Other Ambulatory Visit: Payer: Self-pay

## 2022-06-21 DIAGNOSIS — K219 Gastro-esophageal reflux disease without esophagitis: Secondary | ICD-10-CM

## 2022-06-21 MED ORDER — CLOPIDOGREL BISULFATE 75 MG PO TABS: 75.0000 mg | ORAL_TABLET | Freq: Every day | ORAL | 2 refills | Status: DC

## 2022-06-21 MED ORDER — PANTOPRAZOLE SODIUM 40 MG PO TBEC: 40.0000 mg | DELAYED_RELEASE_TABLET | Freq: Every day | ORAL | 1 refills | Status: DC

## 2022-07-11 ENCOUNTER — Other Ambulatory Visit: Payer: Self-pay | Admitting: Cardiovascular Disease

## 2022-08-15 ENCOUNTER — Encounter: Payer: Self-pay | Admitting: Physician Assistant

## 2022-08-15 ENCOUNTER — Ambulatory Visit: Payer: Medicare Other | Attending: Physician Assistant | Admitting: Physician Assistant

## 2022-08-15 VITALS — BP 110/80 | HR 81 | Ht 69.0 in | Wt 176.0 lb

## 2022-08-15 DIAGNOSIS — E782 Mixed hyperlipidemia: Secondary | ICD-10-CM | POA: Insufficient documentation

## 2022-08-15 DIAGNOSIS — I2510 Atherosclerotic heart disease of native coronary artery without angina pectoris: Secondary | ICD-10-CM | POA: Diagnosis not present

## 2022-08-15 NOTE — Patient Instructions (Signed)
Medication Instructions:  DECREASE Crestor to '5mg'$  Take 1 tablet on Mondays, Wednesdays, and Fridays *If you need a refill on your cardiac medications before your next appointment, please call your pharmacy*   Lab Work: None Ordered   Testing/Procedures: None Ordered   Follow-Up: At [facility], you and your health needs are our priority.  As part of our continuing mission to provide you with exceptional heart care, we have created designated Provider Care Teams.  These Care Teams include your primary Cardiologist (physician) and Advanced Practice Providers (APPs -  Physician Assistants and Nurse Practitioners) who all work together to provide you with the care you need, when you need it.  We recommend signing up for the patient portal called "MyChart".  Sign up information is provided on this After Visit Summary.  MyChart is used to connect with patients for Virtual Visits (Telemedicine).  Patients are able to view lab/test results, encounter notes, upcoming appointments, etc.  Non-urgent messages can be sent to your provider as well.   To learn more about what you can do with MyChart, go to [email or web address].    Your next appointment:   12 month(s)  The format for your next appointment:   In Person  Provider:   Mertie Moores, MD     Other Instructions You have been referred to [place].  Important Information About Sugar

## 2022-08-15 NOTE — Progress Notes (Signed)
Cardiology Office Note:    Date:  08/15/2022   ID:  Jacob Obrien, DOB May 10, 1951, MRN [id number]  PCP:  Christain Sacramento, MD  [facility] Cardiologist:  Mertie Moores, MD  [facility] Electrophysiologist:  None   Chief Complaint: Yearly follow up   History of Present Illness:    Jacob Obrien is a 71 y.o. male with a hx of coronary artery disease s/p DES to LAD October 2009, and hyperlipidemia presents for long due follow-up.  Very active at baseline. He rides cycle.   Patient is here for follow-up.  He reports muscle aches on Crestor 10 mg daily.  Previously did not tolerated daily atorvastatin.  He rides bicycle 5 miles twice a day for most Days.  Denies chest pain, shortness of breath, orthopnea, PND, syncope, lower extremity edema or melena.   Past Medical History:  Diagnosis Date   Allergy    Anxiety    Arthritis    CHF (congestive heart failure) ([place])    Coronary artery disease    status post PTCA and stenting of his LAD in October of 2009. He had placement of a 3.0x4m Promus stent.    Depression    Hyperlipidemia    Myocardial infarction ([facility]    2009   Right groin hernia     Past Surgical History:  Procedure Laterality Date   CORONARY ANGIOPLASTY WITH STENT PLACEMENT  07-2008   stenting of his left anterior descending artery. EF 50-55%. There is slighthypokinesis of the apex. The mid LAD has a 95%-99% stenosis with TIMI grade 2 flow down the LAD and diagonal vessel.   HERNIA REPAIR     Left groin at age 71  INGUINAL HERNIA REPAIR Right 10/30/2017   Procedure: HERNIA REPAIR INGUINAL ADULT WITH MESH;  Surgeon: WRolm Bookbinder MD;  [place]  Service: General;  Laterality: Right;   INSERTION OF MESH Right 10/30/2017   Procedure: INSERTION OF MESH;  Surgeon: WRolm Bookbinder MD;  [place]  Service: General;  Laterality: Right;   VASECTOMY      Current Medications: Current Meds  Medication Sig   aspirin 81 MG tablet Take 81  mg by mouth daily.    Cholecalciferol (VITAMIN D PO) Take 1 capsule by mouth daily.   clonazePAM (KLONOPIN) 0.5 MG disintegrating tablet Take 0.5 tablets by mouth as needed for anxiety.   clopidogrel (PLAVIX) 75 MG tablet Take 1 tablet (75 mg total) by mouth daily.   Coenzyme Q10-Vitamin E 100-150 MG-UNIT CAPS Take 1 tablet by mouth daily.   Cyanocobalamin (B-12 PO) Take 1 tablet by mouth daily.   ezetimibe (ZETIA) 10 MG tablet Take 1 tablet (10 mg total) by mouth daily.   finasteride (PROSCAR) 5 MG tablet Take 5 mg by mouth daily.   Fluoxetine HCl, PMDD, 20 MG TABS Take 1 tablet by mouth daily.   Glucosamine-Chondroitin (GLUCOSAMINE CHONDR COMPLEX PO) Take 1 tablet by mouth 2 (two) times daily.    Multiple Vitamins-Minerals (MULTIVITAMIN PO) Take 1 tablet by mouth daily.    pantoprazole (PROTONIX) 40 MG tablet Take 1 tablet (40 mg total) by mouth daily.   rosuvastatin (CRESTOR) 10 MG tablet Take 1 tablet (10 mg total) by mouth daily.   terazosin (HYTRIN) 5 MG capsule Take 5 mg by mouth every evening.    Current Facility-Administered Medications for the 08/15/22 encounter (Office Visit) with BLeanor Kail PA  Medication   0.9 %  sodium chloride infusion     Allergies:  Atorvastatin   Social History   Socioeconomic History   Marital status: Divorced    Spouse name: Not on file   Number of children: 3   Years of education: Not on file   Highest education level: Not on file  Occupational History   Not on file  Tobacco Use   Smoking status: Never   Smokeless tobacco: Never  Vaping Use   Vaping Use: Never used  Substance and Sexual Activity   Alcohol use: Yes    Comment: Rarely   Drug use: No   Sexual activity: Not on file  Other Topics Concern   Not on file  Social History Narrative   Not on file   Social Determinants of Health   Financial Resource Strain: Not on file  Food Insecurity: Not on file  Transportation Needs: Not on file  Physical Activity: Not on  file  Stress: Not on file  Social Connections: Not on file     Family History: The patient's family history includes Angina in his mother; CAD in his father.    ROS:   Please see the history of present illness.    All other systems reviewed and are negative.   EKGs/Labs/Other Studies Reviewed:    The following studies were reviewed today: As summarized above  EKG:  EKG is  ordered today.  The ekg ordered today demonstrates normal sinus rhythm  Recent Labs: 09/27/2021: BUN 22; Creatinine, Ser 0.92; Potassium 4.5; Sodium 139 12/28/2021: ALT 24  Recent Lipid Panel    Component Value Date/Time   CHOL 152 12/28/2021 0724   TRIG 55 12/28/2021 0724   HDL 74 12/28/2021 0724   CHOLHDL 2.1 12/28/2021 0724   CHOLHDL 1.8 05/25/2016 0802   VLDL 12 05/25/2016 0802   LDLCALC 66 12/28/2021 0724   Physical Exam:    VS:  BP 110/80 (BP Location: Left Arm, Patient Position: Sitting, Cuff Size: Normal)   Pulse 81   Ht '5\' 9"'$  (1.753 m)   Wt 176 lb (79.8 kg)   BMI 25.99 kg/m     Wt Readings from Last 3 Encounters:  08/15/22 176 lb (79.8 kg)  05/11/22 168 lb (76.2 kg)  03/23/22 168 lb (76.2 kg)     GEN:  Well nourished, well developed in no acute distress HEENT: Normal NECK: No JVD; No carotid bruits LYMPHATICS: No lymphadenopathy CARDIAC: RRR, no murmurs, rubs, gallops RESPIRATORY:  Clear to auscultation without rales, wheezing or rhonchi  ABDOMEN: Soft, non-tender, non-distended MUSCULOSKELETAL:  No edema; No deformity  SKIN: Warm and dry NEUROLOGIC:  Alert and oriented x 3 PSYCHIATRIC:  Normal affect   ASSESSMENT AND PLAN:    CAD No angina.  Continue exercise regimen.  Continue dual antiplatelet therapy with aspirin and Plavix.  Continue Zetia.  2.  Hyperlipidemia -  He reports muscle aches on Crestor 10 mg daily.  Previously did not tolerate daily atorvastatin.   - LDL 66 on 12/2021 -Will cut back Crestor to 5 mg 3 times a day -Lipid clinic referral   Medication  Adjustments/Labs and Tests Ordered: Current medicines are reviewed at length with the patient today.  Concerns regarding medicines are outlined above.  Orders Placed This Encounter  Procedures   AMB Referral to Advanced Lipid Disorders Clinic   No orders of the defined types were placed in this encounter.   Patient Instructions  Medication Instructions:  DECREASE Crestor to '5mg'$  Take 1 tablet on Mondays, Wednesdays, and Fridays *If you need a refill on your cardiac medications  before your next appointment, please call your pharmacy*   Lab Work: None Ordered   Testing/Procedures: None Ordered   Follow-Up: At [facility], you and your health needs are our priority.  As part of our continuing mission to provide you with exceptional heart care, we have created designated Provider Care Teams.  These Care Teams include your primary Cardiologist (physician) and Advanced Practice Providers (APPs -  Physician Assistants and Nurse Practitioners) who all work together to provide you with the care you need, when you need it.  We recommend signing up for the patient portal called "MyChart".  Sign up information is provided on this After Visit Summary.  MyChart is used to connect with patients for Virtual Visits (Telemedicine).  Patients are able to view lab/test results, encounter notes, upcoming appointments, etc.  Non-urgent messages can be sent to your provider as well.   To learn more about what you can do with MyChart, go to [email or web address].    Your next appointment:   12 month(s)  The format for your next appointment:   In Person  Provider:   Mertie Moores, MD     Other Instructions You have been referred to [place].  Important Information About Sugar         Jarrett Soho, PA  08/15/2022 4:01 PM     [facility]

## 2022-08-16 NOTE — Addendum Note (Signed)
Addended by: Janan Halter F on: 08/16/2022 07:03 PM   Modules accepted: Orders

## 2022-08-18 NOTE — Addendum Note (Signed)
Addended by: Maren Beach, Zaveon Gillen A on: 08/18/2022 08:36 AM   Modules accepted: Orders

## 2022-10-27 ENCOUNTER — Ambulatory Visit: Payer: Medicare Other

## 2022-11-30 ENCOUNTER — Ambulatory Visit: Payer: Medicare Other

## 2022-12-11 ENCOUNTER — Other Ambulatory Visit: Payer: Self-pay | Admitting: Gastroenterology

## 2022-12-11 DIAGNOSIS — K219 Gastro-esophageal reflux disease without esophagitis: Secondary | ICD-10-CM

## 2023-01-05 ENCOUNTER — Ambulatory Visit: Payer: Medicare Other | Attending: Internal Medicine | Admitting: Pharmacist

## 2023-01-05 DIAGNOSIS — I2510 Atherosclerotic heart disease of native coronary artery without angina pectoris: Secondary | ICD-10-CM | POA: Diagnosis present

## 2023-01-05 DIAGNOSIS — E782 Mixed hyperlipidemia: Secondary | ICD-10-CM | POA: Diagnosis present

## 2023-01-05 NOTE — Patient Instructions (Signed)
We will call you with your results tomorrow and discuss

## 2023-01-05 NOTE — Progress Notes (Signed)
Patient ID: Jacob Obrien                 DOB: 1951/06/10                    MRN: [id number]      HPI: Jacob Obrien is a 72 y.o. male patient referred to lipid clinic by  Leanor Kail, PA. PMH is significant for coronary artery disease s/p DES to LAD (2009), HLD, CHF  At visit on 08/15/2022 with Leanor Kail, PA patient reported muscle aches with rosuvastatin 10mg  daily. Rosuvastatin was changed to 5mg  on Monday, Wednesday, and Friday. Last LDL-C from 12/28/2021 was 66.   At visit today, patient reported tolerance to rosuvastatin 5mg  3x/week (Mon, Wed, Fri) and ezetimibe 5mg  daily. Patient reports never having taken ezetimibe 10mg . Patient reports being very active. 1.5 hrs/day of bicycling, swimming, landscaping, or hiking each day. Reviewed options for additional LDL lowering including increasing rosuvastatin to every day, PCSK-9 inhibitors, and inclisiran. Patient has a supplemental health insurance plan that would allow for a reduced inclisiran price. Patient reported wanting to avoid increasing rosuvastatin if possible.   Current Medications: Rosuvastatin 5mg  (3x/week), ezetimibe 5mg   Intolerances: rosuvastatin 10mg , atorvastatin 40mg  Risk Factors: CAD LDL-C goal: < 70 ApoB goal: < 80  Diet:  Grains, fruits/vegetables, little red meat, red wine every evening (2 glasses)  Exercise: 1.5 day/bicycling or swimming, landscaping or hiking   Family History: Angina in mother, CAD in father.   Social History: Never smoked. Alcohol use (rarely). No drug use.   Labs: Lipid Panel     Component Value Date/Time   CHOL 152 12/28/2021 0724   TRIG 55 12/28/2021 0724   HDL 74 12/28/2021 0724   CHOLHDL 2.1 12/28/2021 0724   CHOLHDL 1.8 05/25/2016 0802   VLDL 12 05/25/2016 0802   LDLCALC 66 12/28/2021 0724   LABVLDL 12 12/28/2021 0724    Past Medical History:  Diagnosis Date   Allergy    Anxiety    Arthritis    CHF (congestive heart failure) ([place])    Coronary  artery disease    status post PTCA and stenting of his LAD in October of 2009. He had placement of a 3.0x38mm Promus stent.    Depression    Hyperlipidemia    Myocardial infarction [facility])    2009   Right groin hernia     Current Outpatient Medications on File Prior to Visit  Medication Sig Dispense Refill   ezetimibe (ZETIA) 10 MG tablet Take 0.5 tablets (5 mg total) by mouth daily. 45 tablet 3   [START ON 01/06/2023] rosuvastatin (CRESTOR) 5 MG tablet Take 1 tablet (5 mg total) by mouth 3 (three) times a week. 36 tablet 3   aspirin 81 MG tablet Take 81 mg by mouth daily.      Cholecalciferol (VITAMIN D PO) Take 1 capsule by mouth daily.     clonazePAM (KLONOPIN) 0.5 MG disintegrating tablet Take 0.5 tablets by mouth as needed for anxiety.     clopidogrel (PLAVIX) 75 MG tablet Take 1 tablet (75 mg total) by mouth daily. 90 tablet 2   Coenzyme Q10-Vitamin E 100-150 MG-UNIT CAPS Take 1 tablet by mouth daily.     Cyanocobalamin (B-12 PO) Take 1 tablet by mouth daily.     finasteride (PROSCAR) 5 MG tablet Take 5 mg by mouth daily.     Fluoxetine HCl, PMDD, 20 MG TABS Take 1 tablet by mouth daily.     Glucosamine-Chondroitin (  GLUCOSAMINE CHONDR COMPLEX PO) Take 1 tablet by mouth 2 (two) times daily.      Multiple Vitamins-Minerals (MULTIVITAMIN PO) Take 1 tablet by mouth daily.      pantoprazole (PROTONIX) 40 MG tablet TAKE 1 TABLET EVERY DAY 90 tablet 0   terazosin (HYTRIN) 5 MG capsule Take 5 mg by mouth every evening.      Current Facility-Administered Medications on File Prior to Visit  Medication Dose Route Frequency Provider Last Rate Last Admin   0.9 %  sodium chloride infusion  500 mL Intravenous Once Ladene Artist, MD        Allergies  Allergen Reactions   Atorvastatin Other (See Comments)     Muscle pain    Assessment/Plan:  1. Hyperlipidemia -  Hyperlipidemia Assessment:  Patient reports tolerance to rosuvastatin 5mg  (3x/week) and ezetimibe 5mg  daily Last LDL-C from  12/28/2021 was 66 (on rosuvastatin 10mg  daily) Discussed the potential for additional LDL lowering depending on current LDL. Options include increasing rosuvastatin 5mg  to every day, PCSK-9 inhibitor, or inclisiran  Patient reported wanting to avoid increasing rosuvastatin if possible.   Plan:  Labs today Continue rosuvastatin 5mg  (3x/week) and ezetimibe 5mg  daily  Encouraged continued exercise and healthy diet for LDL and CV risk reduction  Will follow-up with results tomorrow and discuss next steps    Thank you,  Wallene Huh, PharmD Candidate   Ramond Dial, Pharm.D, BCPS, CPP [facility] [address] [place], [place] [address]  Phone: [phone]; Fax: [phone]

## 2023-01-05 NOTE — Assessment & Plan Note (Signed)
Assessment:  Patient reports tolerance to rosuvastatin 5mg  (3x/week) and ezetimibe 5mg  daily Last LDL-C from 12/28/2021 was 66 (on rosuvastatin 10mg  daily) Discussed the potential for additional LDL lowering depending on current LDL. Options include increasing rosuvastatin 5mg  to every day, PCSK-9 inhibitor, or inclisiran  Patient reported wanting to avoid increasing rosuvastatin if possible.   Plan:  Labs today Continue rosuvastatin 5mg  (3x/week) and ezetimibe 5mg  daily  Encouraged continued exercise and healthy diet for LDL and CV risk reduction  Will follow-up with results tomorrow and discuss next steps

## 2023-01-06 ENCOUNTER — Telehealth: Payer: Self-pay

## 2023-01-06 DIAGNOSIS — E782 Mixed hyperlipidemia: Secondary | ICD-10-CM

## 2023-01-06 LAB — HEPATIC FUNCTION PANEL
ALT: 31 IU/L (ref 0–44)
AST: 27 IU/L (ref 0–40)
Albumin: 4.4 g/dL (ref 3.8–4.8)
Alkaline Phosphatase: 62 IU/L (ref 44–121)
Bilirubin Total: 0.6 mg/dL (ref 0.0–1.2)
Bilirubin, Direct: 0.18 mg/dL (ref 0.00–0.40)
Total Protein: 6.6 g/dL (ref 6.0–8.5)

## 2023-01-06 LAB — LIPID PANEL
Chol/HDL Ratio: 2.1 ratio (ref 0.0–5.0)
Cholesterol, Total: 167 mg/dL (ref 100–199)
HDL: 78 mg/dL (ref >39)
LDL Chol Calc (NIH): 76 mg/dL (ref 0–99)
Triglycerides: 67 mg/dL (ref 0–149)
VLDL Cholesterol Cal: 13 mg/dL (ref 5–40)

## 2023-01-06 MED ORDER — EZETIMIBE 10 MG PO TABS: 10.0000 mg | ORAL_TABLET | Freq: Every day | ORAL | 3 refills | Status: DC

## 2023-01-06 NOTE — Telephone Encounter (Signed)
Discussed most recent LDL results with patient. LDL-C from 01/05/2023 was 76. Goal <70. Patient is currently taking rosuvastatin 5mg  3x/week and ezetimibe 5mg . Discussed options for additional LDL lowering including increasing rosuvastatin frequency to 4x/week, increasing ezetimibe to 10mg  daily, and starting inclisiran.   Patient would like to increase ezetimibe to 10mg  daily in addition to rosuvastatin 5mg  3x/week.   Follow-up for labs May 21st, 2024.

## 2023-01-22 ENCOUNTER — Other Ambulatory Visit: Payer: Self-pay | Admitting: Cardiovascular Disease

## 2023-01-23 NOTE — Telephone Encounter (Signed)
Rx refill sent to pharmacy. 

## 2023-02-23 ENCOUNTER — Other Ambulatory Visit: Payer: Self-pay | Admitting: Gastroenterology

## 2023-02-23 DIAGNOSIS — K219 Gastro-esophageal reflux disease without esophagitis: Secondary | ICD-10-CM

## 2023-03-07 ENCOUNTER — Ambulatory Visit: Payer: Medicare Other | Attending: Cardiovascular Disease

## 2023-03-07 DIAGNOSIS — E782 Mixed hyperlipidemia: Secondary | ICD-10-CM

## 2023-03-07 LAB — HEPATIC FUNCTION PANEL
ALT: 30 IU/L (ref 0–44)
AST: 36 IU/L (ref 0–40)
Albumin: 4.2 g/dL (ref 3.8–4.8)
Alkaline Phosphatase: 56 IU/L (ref 44–121)
Bilirubin Total: 0.6 mg/dL (ref 0.0–1.2)
Bilirubin, Direct: 0.2 mg/dL (ref 0.00–0.40)
Total Protein: 6.6 g/dL (ref 6.0–8.5)

## 2023-03-07 LAB — LIPID PANEL
Chol/HDL Ratio: 2.3 ratio (ref 0.0–5.0)
Cholesterol, Total: 161 mg/dL (ref 100–199)
HDL: 69 mg/dL (ref >39)
LDL Chol Calc (NIH): 79 mg/dL (ref 0–99)
Triglycerides: 68 mg/dL (ref 0–149)
VLDL Cholesterol Cal: 13 mg/dL (ref 5–40)

## 2023-06-17 ENCOUNTER — Other Ambulatory Visit: Payer: Self-pay | Admitting: Cardiovascular Disease

## 2023-07-31 ENCOUNTER — Other Ambulatory Visit: Payer: Self-pay | Admitting: Gastroenterology

## 2023-07-31 DIAGNOSIS — K219 Gastro-esophageal reflux disease without esophagitis: Secondary | ICD-10-CM

## 2023-09-01 ENCOUNTER — Other Ambulatory Visit: Payer: Self-pay | Admitting: Cardiovascular Disease

## 2023-09-10 ENCOUNTER — Other Ambulatory Visit: Payer: Self-pay | Admitting: Gastroenterology

## 2023-09-10 DIAGNOSIS — K219 Gastro-esophageal reflux disease without esophagitis: Secondary | ICD-10-CM

## 2023-11-29 ENCOUNTER — Other Ambulatory Visit: Payer: Self-pay | Admitting: Cardiovascular Disease

## 2023-12-05 ENCOUNTER — Telehealth: Payer: Self-pay | Admitting: Gastroenterology

## 2023-12-05 DIAGNOSIS — K219 Gastro-esophageal reflux disease without esophagitis: Secondary | ICD-10-CM

## 2023-12-05 MED ORDER — PANTOPRAZOLE SODIUM 40 MG PO TBEC: 40.0000 mg | DELAYED_RELEASE_TABLET | Freq: Every day | ORAL | 0 refills | Status: DC

## 2023-12-05 NOTE — Telephone Encounter (Signed)
 Prescription sent to patient's pharmacy until scheduled appt.

## 2023-12-05 NOTE — Telephone Encounter (Signed)
Patient called and stated that he was needing a refill for his pantoprazole. Patient stated that he has an appointment already established with [facility] but was wondering if he can get refills until he is able to be seen. Patient is requesting a call back. Please advise.

## 2023-12-21 ENCOUNTER — Ambulatory Visit: Payer: Medicare Other | Admitting: Gastroenterology

## 2024-01-12 NOTE — Progress Notes (Signed)
 Chief Complaint: Med refill Primary GI MD: Dr. Russella Dar  HPI: 73 year old male history of CAD s/p DES to LAD (2009), on Plavix, HLD, CHF, presents for med refill.  Patient presents for refill of pantoprazole 40mg  once daily. He states this controls his GERD adequately without breakthrough symptoms. He is doing well with no issues today. He states he has tried omeprazole in the past that was OTC but it resulted in lethargy. Doing well on pantoprazole.  Last labs (hepatic panel and lipid panel) was normal 02/2023. Last BMP 09/2021 normal. Last CBC in 2020 was normal.   PREVIOUS GI WORKUP   EGD 04/2022 - LA Grade C reflux esophagitis with no bleeding. Biopsied.  - Benign- appearing esophageal stenosis.  - Medium- sized hiatal hernia.  - Otherwise normal appearing stomach. Biopsied.  - Duodenal erosions without bleeding.  Colonoscopy 04/2022 - Four 5 to 8 mm polyps in the distal sigmoid colon, in the transverse colon and in the ascending colon, removed with a cold snare. Resected and retrieved. - Mild diverticulosis in the right colon.  - Mild diverticulosis in the left colon.  - The examination was otherwise normal on direct and retroflexion views. - repeat 3 years (04/2025)  Diagnosis 1. Surgical [P], colon, ascending, transverse, polyp (3) - TUBULAR ADENOMA, FRAGMENTS. 2. Surgical [P], distal esophagus - SQUAMOUS MUCOSA WITH BASAL CELL HYPERPLASIA AND ACTIVE ESOPHAGITIS INCLUDING INCREASED INTRAEPITHELIAL EOSINOPHILS (FOCALLY REACHING 12/HIGH POWER FIELD), GIVEN THE LOCATION THE DIFFERENTIAL DIAGNOSIS WOULD INCLUDE BOTH REFLUX AND EOSINOPHILIC ESOPHAGITIS. 3. Surgical [P], gastric - ANTRAL AND OXYNTIC MUCOSA WITH NO SIGNIFICANT PATHOLOGY. - NO HELICOBACTER PYLORI ORGANISMS IDENTIFIED ON H&E STAINED SLIDE.  colonoscopy by Dr. Loreta Ave in 2009 and a 5 mm tubular adenoma was removed from the cecum.   Cologuard performed around 2015 that was negative   Past Medical History:  Diagnosis  Date   Allergy    Anxiety    Arthritis    CHF (congestive heart failure) (HCC)    Coronary artery disease    status post PTCA and stenting of his LAD in October of 2009. He had placement of a 3.0x36mm Promus stent.    Depression    Hyperlipidemia    Myocardial infarction [facility])    2009   Right groin hernia     Past Surgical History:  Procedure Laterality Date   CORONARY ANGIOPLASTY WITH STENT PLACEMENT  07-2008   stenting of his left anterior descending artery. EF 50-55%. There is slighthypokinesis of the apex. The mid LAD has a 95%-99% stenosis with TIMI grade 2 flow down the LAD and diagonal vessel.   HERNIA REPAIR     Left groin at age 38   INGUINAL HERNIA REPAIR Right 10/30/2017   Procedure: HERNIA REPAIR INGUINAL ADULT WITH MESH;  Surgeon: Emelia Loron, MD;  Location: [facility] OR;  Service: General;  Laterality: Right;   INSERTION OF MESH Right 10/30/2017   Procedure: INSERTION OF MESH;  Surgeon: Emelia Loron, MD;  Location: [facility] OR;  Service: General;  Laterality: Right;   VASECTOMY      Current Outpatient Medications  Medication Sig Dispense Refill   aspirin 81 MG tablet Take 81 mg by mouth daily.      Cholecalciferol (VITAMIN D PO) Take 1 capsule by mouth daily.     clonazePAM (KLONOPIN) 0.5 MG disintegrating tablet Take 0.5 tablets by mouth as needed for anxiety.     clopidogrel (PLAVIX) 75 MG tablet TAKE 1 TABLET EVERY DAY 15 tablet 0   Coenzyme Q10-Vitamin E  100-150 MG-UNIT CAPS Take 1 tablet by mouth daily.     Cyanocobalamin (B-12 PO) Take 1 tablet by mouth daily.     ezetimibe (ZETIA) 10 MG tablet Take 1 tablet (10 mg total) by mouth daily. 90 tablet 3   finasteride (PROSCAR) 5 MG tablet Take 5 mg by mouth daily.     Fluoxetine HCl, PMDD, 20 MG TABS Take 1 tablet by mouth daily.     Glucosamine-Chondroitin (GLUCOSAMINE CHONDR COMPLEX PO) Take 1 tablet by mouth 2 (two) times daily.      Multiple Vitamins-Minerals (MULTIVITAMIN PO) Take 1 tablet by mouth daily.       pantoprazole (PROTONIX) 40 MG tablet Take 1 tablet (40 mg total) by mouth daily. 90 tablet 0   rosuvastatin (CRESTOR) 5 MG tablet Take 1 tablet (5 mg total) by mouth 3 (three) times a week. 36 tablet 3   terazosin (HYTRIN) 5 MG capsule Take 5 mg by mouth every evening.      Current Facility-Administered Medications  Medication Dose Route Frequency Provider Last Rate Last Admin   0.9 %  sodium chloride infusion  500 mL Intravenous Once Meryl Dare, MD        Allergies as of 01/15/2024 - Review Complete 01/15/2024  Allergen Reaction Noted   Atorvastatin Other (See Comments) 12/23/2021    Family History  Problem Relation Age of Onset   Angina Mother    CAD Father     Social History   Socioeconomic History   Marital status: Divorced    Spouse name: Not on file   Number of children: 3   Years of education: Not on file   Highest education level: Not on file  Occupational History   Not on file  Tobacco Use   Smoking status: Never   Smokeless tobacco: Never  Vaping Use   Vaping status: Never Used  Substance and Sexual Activity   Alcohol use: Yes    Comment: Rarely   Drug use: No   Sexual activity: Not on file  Other Topics Concern   Not on file  Social History Narrative   Not on file   Social Drivers of Health   Financial Resource Strain: Not on file  Food Insecurity: Low Risk  (07/21/2023)   Received from [facility]   Hunger Vital Sign    Worried About Running Out of Food in the Last Year: Never true    Ran Out of Food in the Last Year: Never true  Transportation Needs: No Transportation Needs (07/21/2023)   Received from [facility]    In the past 12 months, has lack of reliable transportation kept you from medical appointments, meetings, work or from getting things needed for daily living? : No  Physical Activity: Not on file  Stress: Not on file  Social Connections: Not on file  Intimate Partner Violence: Not on file    Review of  Systems:    Constitutional: No weight loss, fever, chills, weakness or fatigue HEENT: Eyes: No change in vision               Ears, Nose, Throat:  No change in hearing or congestion Skin: No rash or itching Cardiovascular: No chest pain, chest pressure or palpitations   Respiratory: No SOB or cough Gastrointestinal: See HPI and otherwise negative Genitourinary: No dysuria or change in urinary frequency Neurological: No headache, dizziness or syncope Musculoskeletal: No new muscle or joint pain Hematologic: No bleeding or bruising Psychiatric: No history  of depression or anxiety    Physical Exam:  Vital signs: BP 122/70   Pulse 75   Ht 5\' 9"  (1.753 m)   Wt 182 lb (82.6 kg)   BMI 26.88 kg/m   Constitutional: NAD, Well developed, Well nourished, alert and cooperative Head:  Normocephalic and atraumatic. Eyes:   PEERL, EOMI. No icterus. Conjunctiva pink. Respiratory: Respirations even and unlabored. Lungs clear to auscultation bilaterally.   No wheezes, crackles, or rhonchi.  Cardiovascular:  Regular rate and rhythm. No peripheral edema, cyanosis or pallor.  Gastrointestinal:  Soft, nondistended, nontender. No rebound or guarding. Normal bowel sounds. No appreciable masses or hepatomegaly. Rectal:  Not performed.  Msk:  Symmetrical without gross deformities. Without edema, no deformity or joint abnormality.  Neurologic:  Alert and  oriented x4;  grossly normal neurologically.  Skin:   Dry and intact without significant lesions or rashes. Psychiatric: Oriented to person, place and time. Demonstrates good judgement and reason without abnormal affect or behaviors.   RELEVANT LABS AND IMAGING: CBC    Component Value Date/Time   WBC 5.4 10/25/2017 0824   RBC 4.63 10/25/2017 0824   HGB 15.3 10/25/2017 0824   HCT 45.5 10/25/2017 0824   PLT 198 10/25/2017 0824   MCV 98.3 10/25/2017 0824   MCH 33.0 10/25/2017 0824   MCHC 33.6 10/25/2017 0824   RDW 12.8 10/25/2017 0824    CMP      Component Value Date/Time   NA 139 09/27/2021 0712   K 4.5 09/27/2021 0712   CL 102 09/27/2021 0712   CO2 25 09/27/2021 0712   GLUCOSE 99 09/27/2021 0712   GLUCOSE 76 10/25/2017 0824   BUN 22 09/27/2021 0712   CREATININE 0.92 09/27/2021 0712   CREATININE 0.94 05/25/2016 0802   CALCIUM 9.3 09/27/2021 0712   PROT 6.6 03/07/2023 0913   ALBUMIN 4.2 03/07/2023 0913   AST 36 03/07/2023 0913   ALT 30 03/07/2023 0913   ALKPHOS 56 03/07/2023 0913   BILITOT 0.6 03/07/2023 0913   GFRNONAA 80 07/17/2018 0740   GFRAA 92 07/17/2018 0740     Assessment/Plan:   GERD with esophagitis EGD 2023 with LA grade C esophagitis and medium hiatal hernia with symptoms well controlled on pantoprazole 40mg  once daily wihtout breakthrough.  He also had dysphagia at that time and was found to have stenosis that was dilated and has had improvement since.  Has not had kidneys checked since 2022 and no CBC checked since 2020 - CBC, CMP, B12/folate, magnesium labs (unable to get Vit D due to insurance cost - if abnormal labs will add on) - Refill pantoprazole for 1 year - Advised PPI risks - Follow-up 1 year  History of colon polyps For tubular adenomas on colonoscopy in 2023 with a recall of 04/2025 - Due for colonoscopy 04/2025, will schedule at 1 year follow-up  Assigned to Dr. Liborio Nixon, PA-C [facility] 01/15/2024, 9:31 AM  Cc: Barbie Banner, MD

## 2024-01-15 ENCOUNTER — Encounter: Payer: Self-pay | Admitting: Gastroenterology

## 2024-01-15 ENCOUNTER — Ambulatory Visit (INDEPENDENT_AMBULATORY_CARE_PROVIDER_SITE_OTHER): Payer: Medicare Other | Admitting: Gastroenterology

## 2024-01-15 ENCOUNTER — Other Ambulatory Visit (INDEPENDENT_AMBULATORY_CARE_PROVIDER_SITE_OTHER)

## 2024-01-15 VITALS — BP 122/70 | HR 75 | Ht 69.0 in | Wt 182.0 lb

## 2024-01-15 DIAGNOSIS — K2100 Gastro-esophageal reflux disease with esophagitis, without bleeding: Secondary | ICD-10-CM

## 2024-01-15 DIAGNOSIS — K219 Gastro-esophageal reflux disease without esophagitis: Secondary | ICD-10-CM

## 2024-01-15 DIAGNOSIS — R1310 Dysphagia, unspecified: Secondary | ICD-10-CM | POA: Diagnosis not present

## 2024-01-15 DIAGNOSIS — Z860101 Personal history of adenomatous and serrated colon polyps: Secondary | ICD-10-CM | POA: Diagnosis not present

## 2024-01-15 DIAGNOSIS — Z79899 Other long term (current) drug therapy: Secondary | ICD-10-CM

## 2024-01-15 DIAGNOSIS — Z760 Encounter for issue of repeat prescription: Secondary | ICD-10-CM | POA: Diagnosis not present

## 2024-01-15 LAB — CBC WITH DIFFERENTIAL/PLATELET
Basophils Absolute: 0 10*3/uL (ref 0.0–0.1)
Basophils Relative: 1 % (ref 0.0–3.0)
Eosinophils Absolute: 0.2 10*3/uL (ref 0.0–0.7)
Eosinophils Relative: 3.9 % (ref 0.0–5.0)
HCT: 45.7 % (ref 39.0–52.0)
Hemoglobin: 15.5 g/dL (ref 13.0–17.0)
Lymphocytes Relative: 25.2 % (ref 12.0–46.0)
Lymphs Abs: 1.1 10*3/uL (ref 0.7–4.0)
MCHC: 34 g/dL (ref 30.0–36.0)
MCV: 99.5 fl (ref 78.0–100.0)
Monocytes Absolute: 0.4 10*3/uL (ref 0.1–1.0)
Monocytes Relative: 9.8 % (ref 3.0–12.0)
Neutro Abs: 2.7 10*3/uL (ref 1.4–7.7)
Neutrophils Relative %: 60.1 % (ref 43.0–77.0)
Platelets: 255 10*3/uL (ref 150.0–400.0)
RBC: 4.59 Mil/uL (ref 4.22–5.81)
RDW: 13.5 % (ref 11.5–15.5)
WBC: 4.5 10*3/uL (ref 4.0–10.5)

## 2024-01-15 LAB — B12 AND FOLATE PANEL
Folate: >25.2 ng/mL (ref >5.9)
Vitamin B-12: 1198 pg/mL — ABNORMAL HIGH (ref 211–911)

## 2024-01-15 LAB — COMPREHENSIVE METABOLIC PANEL WITH GFR
ALT: 22 U/L (ref 0–53)
AST: 20 U/L (ref 0–37)
Albumin: 4.4 g/dL (ref 3.5–5.2)
Alkaline Phosphatase: 53 U/L (ref 39–117)
BUN: 25 mg/dL — ABNORMAL HIGH (ref 6–23)
CO2: 26 meq/L (ref 19–32)
Calcium: 9.2 mg/dL (ref 8.4–10.5)
Chloride: 103 meq/L (ref 96–112)
Creatinine, Ser: 1.04 mg/dL (ref 0.40–1.50)
GFR: 71.1 mL/min (ref >60.00)
Glucose, Bld: 109 mg/dL — ABNORMAL HIGH (ref 70–99)
Potassium: 4.3 meq/L (ref 3.5–5.1)
Sodium: 139 meq/L (ref 135–145)
Total Bilirubin: 0.7 mg/dL (ref 0.2–1.2)
Total Protein: 7.1 g/dL (ref 6.0–8.3)

## 2024-01-15 LAB — MAGNESIUM: Magnesium: 2 mg/dL (ref 1.5–2.5)

## 2024-01-15 MED ORDER — PANTOPRAZOLE SODIUM 40 MG PO TBEC: 40.0000 mg | DELAYED_RELEASE_TABLET | Freq: Every day | ORAL | 2 refills | Status: DC

## 2024-01-15 NOTE — Patient Instructions (Addendum)
 Your provider has requested that you go to the basement level for lab work before leaving today. Press "B" on the elevator. The lab is located at the first door on the left as you exit the elevator.  We have sent the following medications to your pharmacy for you to pick up at your convenience:  Pantoprazole : one tablet daily  _______________________________________________________  If your blood pressure at your visit was 140/90 or greater, please contact your primary care physician to follow up on this.  _______________________________________________________  If you are age 34 or older, your body mass index should be between 23-30. Your Body mass index is 26.88 kg/m. If this is out of the aforementioned range listed, please consider follow up with your Primary Care Provider.  If you are age 47 or younger, your body mass index should be between 19-25. Your Body mass index is 26.88 kg/m. If this is out of the aformentioned range listed, please consider follow up with your Primary Care Provider.   ________________________________________________________  The [facility] providers would like to encourage you to use [facility] to communicate with providers for non-urgent requests or questions.  Due to long hold times on the telephone, sending your provider a message by [facility] may be a faster and more efficient way to get a response.  Please allow 48 business hours for a response.  Please remember that this is for non-urgent requests.  _______________________________________________________

## 2024-01-25 NOTE — Progress Notes (Signed)
 Addendum: Reviewed and agree with assessment and management plan. Asha Grumbine, Carie Caddy, MD

## 2024-03-05 ENCOUNTER — Other Ambulatory Visit: Payer: Self-pay | Admitting: Gastroenterology

## 2024-03-05 DIAGNOSIS — K219 Gastro-esophageal reflux disease without esophagitis: Secondary | ICD-10-CM

## 2024-06-18 ENCOUNTER — Other Ambulatory Visit: Payer: Self-pay

## 2024-06-19 ENCOUNTER — Other Ambulatory Visit: Payer: Self-pay | Admitting: Nurse Practitioner

## 2024-06-19 MED ORDER — CLOPIDOGREL BISULFATE 75 MG PO TABS: 75.0000 mg | ORAL_TABLET | Freq: Every day | ORAL | 0 refills | Status: DC

## 2024-06-26 ENCOUNTER — Telehealth: Payer: Self-pay | Admitting: Pharmacist

## 2024-06-26 NOTE — Telephone Encounter (Signed)
 Pt has enough until appt with Cardiology. Just received 15 on 06/18/24 to local Pharmacy instead of [facility]. Pt will pick this one up because he is completely out and on 9/12 appt, he will request his RX to go to [facility].

## 2024-06-26 NOTE — Telephone Encounter (Signed)
*  STAT* If patient is at the pharmacy, call can be transferred to refill team.   1. Which medications need to be refilled? (please list name of each medication and dose if known)   clopidogrel  (PLAVIX ) 75 MG tablet     2. Would you like to learn more about the convenience, safety, & potential cost savings by using the [facility]? No   3. Are you open to using the [facility] (Type [facility]. No   4. Which pharmacy/location (including street and city if local pharmacy) is medication to be sent to?[facility] - [place], [place] - [address]    5. Do they need a 30 day or 90 day supply? 90 day    Pt is out. Pt scheduled for annual on 06/28/24

## 2024-06-27 NOTE — Progress Notes (Signed)
 Cardiology Office Note    Patient Name: Jacob Obrien Date of Encounter: 06/27/2024  Primary Care Provider:  Tanda Prentice DEL, MD Primary Cardiologist:  Aleene Passe, MD (Inactive) Primary Electrophysiologist: None   Past Medical History    Past Medical History:  Diagnosis Date   Allergy    Anxiety    Arthritis    CHF (congestive heart failure) (HCC)    Coronary artery disease    status post PTCA and stenting of his LAD in October of 2009. He had placement of a 3.0x11mm Promus stent.    Depression    Hyperlipidemia    Myocardial infarction [facility])    2009   Right groin hernia     History of Present Illness  Jacob Obrien is a 73 y.o. male with a PMH of CAD s/p MI with PCI/DES to LAD 07/2008, HLD GERD who presents today for annual follow-up.  Jacob Obrien has a past medical history of MI that occurred in 2009 and was treated with PTCA and stenting with DES to the mid LAD on 07/2008.  He has done well at subsequent follow-ups but has noted some muscle aches and was referred to the clinic Crestor  cut back at his previous visit.  He on Crestor  and Zetia  for statin management.  Patient denies chest pain, palpitations, dyspnea, PND, orthopnea, nausea, vomiting, dizziness, syncope, edema, weight gain, or early satiety.   Discussed the use of AI scribe software for clinical note transcription with the patient, who gave verbal consent to proceed.  History of Present Illness    ***Notes:   Review of Systems  Please see the history of present illness.    All other systems reviewed and are otherwise negative except as noted above.  Physical Exam    Wt Readings from Last 3 Encounters:  01/15/24 182 lb (82.6 kg)  08/15/22 176 lb (79.8 kg)  05/11/22 168 lb (76.2 kg)   CD:Uyzmz were no vitals filed for this visit.,There is no height or weight on file to calculate BMI. GEN: Well nourished, well developed in no acute distress Neck: No JVD; No carotid bruits Pulmonary:  Clear to auscultation without rales, wheezing or rhonchi  Cardiovascular: Normal rate. Regular rhythm. Normal S1. Normal S2.   Murmurs: There is no murmur.  ABDOMEN: Soft, non-tender, non-distended EXTREMITIES:  No edema; No deformity   EKG/LABS/ Recent Cardiac Studies   ECG personally reviewed by me today - ***  Risk Assessment/Calculations:   {Does this patient have ATRIAL FIBRILLATION?:[phone]}      Lab Results  Component Value Date   WBC 4.5 01/15/2024   HGB 15.5 01/15/2024   HCT 45.7 01/15/2024   MCV 99.5 01/15/2024   PLT 255.0 01/15/2024   Lab Results  Component Value Date   CREATININE 1.04 01/15/2024   BUN 25 (H) 01/15/2024   NA 139 01/15/2024   K 4.3 01/15/2024   CL 103 01/15/2024   CO2 26 01/15/2024   Lab Results  Component Value Date   CHOL 161 03/07/2023   HDL 69 03/07/2023   LDLCALC 79 03/07/2023   TRIG 68 03/07/2023   CHOLHDL 2.3 03/07/2023    No results found for: HGBA1C Assessment & Plan    Assessment and Plan Assessment & Plan     1.  History of CAD  2.  Hyperlipidemia  3.  GERD  4.***      Disposition: Follow-up with Aleene Passe, MD (Inactive) or APP in *** months {Are you ordering a CV Procedure (e.g.  stress test, cath, DCCV, TEE, etc)?   Press F2        :[id number]}   Signed, Wyn Raddle, Jackee Shove, NP 06/27/2024, 7:28 AM [facility]

## 2024-06-28 ENCOUNTER — Ambulatory Visit: Attending: Nurse Practitioner | Admitting: Nurse Practitioner

## 2024-06-28 ENCOUNTER — Encounter: Payer: Self-pay | Admitting: Nurse Practitioner

## 2024-06-28 VITALS — BP 120/76 | HR 65 | Ht 70.0 in | Wt 186.0 lb

## 2024-06-28 DIAGNOSIS — K219 Gastro-esophageal reflux disease without esophagitis: Secondary | ICD-10-CM | POA: Diagnosis present

## 2024-06-28 DIAGNOSIS — E782 Mixed hyperlipidemia: Secondary | ICD-10-CM | POA: Diagnosis present

## 2024-06-28 DIAGNOSIS — I2510 Atherosclerotic heart disease of native coronary artery without angina pectoris: Secondary | ICD-10-CM | POA: Insufficient documentation

## 2024-06-28 MED ORDER — EZETIMIBE 10 MG PO TABS: 10.0000 mg | ORAL_TABLET | Freq: Every day | ORAL | 1 refills | Status: DC

## 2024-06-28 MED ORDER — ROSUVASTATIN CALCIUM 5 MG PO TABS: 5.0000 mg | ORAL_TABLET | ORAL | 1 refills | Status: DC

## 2024-06-28 MED ORDER — CLOPIDOGREL BISULFATE 75 MG PO TABS: 75.0000 mg | ORAL_TABLET | Freq: Every day | ORAL | 1 refills | Status: DC

## 2024-06-28 NOTE — Patient Instructions (Signed)
 Medication Instructions:  Your physician recommends that you continue on your current medications as directed. Please refer to the Current Medication list given to you today. *If you need a refill on your cardiac medications before your next appointment, please call your pharmacy*  Lab Work: None Ordered If you have labs (blood work) drawn today and your tests are completely normal, you will receive your results only by: MyChart Message (if you have MyChart) OR A paper copy in the mail If you have any lab test that is abnormal or we need to change your treatment, we will call you to review the results.  Testing/Procedures: None Ordered  Follow-Up: At [facility], you and your health needs are our priority.  As part of our continuing mission to provide you with exceptional heart care, our providers are all part of one team.  This team includes your primary Cardiologist (physician) and Advanced Practice Providers or APPs (Physician Assistants and Nurse Practitioners) who all work together to provide you with the care you need, when you need it.  Your next appointment:   6 month(s)  Provider:   Emeline Bitter, MD    We recommend signing up for the patient portal called MyChart.  Sign up information is provided on this After Visit Summary.  MyChart is used to connect with patients for Virtual Visits (Telemedicine).  Patients are able to view lab/test results, encounter notes, upcoming appointments, etc.  Non-urgent messages can be sent to your provider as well.   To learn more about what you can do with MyChart, go to [email or web address].   Other Instructions

## 2024-09-19 ENCOUNTER — Other Ambulatory Visit: Payer: Self-pay | Admitting: Gastroenterology

## 2024-09-19 DIAGNOSIS — K219 Gastro-esophageal reflux disease without esophagitis: Secondary | ICD-10-CM

## 2024-11-17 ENCOUNTER — Other Ambulatory Visit: Payer: Self-pay | Admitting: Nurse Practitioner

## 2024-11-20 NOTE — Telephone Encounter (Signed)
 Labs on 01/25/24
